# Patient Record
Sex: Male | Born: 1985 | Race: White | Hispanic: No | Marital: Single | Smoking: Current every day smoker
Health system: Southern US, Community
[De-identification: ages and names within clinical notes are randomized; demographics above are authoritative.]

## PROBLEM LIST (undated history)

## (undated) DIAGNOSIS — F32A Depression, unspecified: Secondary | ICD-10-CM

## (undated) DIAGNOSIS — Z9081 Acquired absence of spleen: Secondary | ICD-10-CM

## (undated) DIAGNOSIS — N201 Calculus of ureter: Secondary | ICD-10-CM

## (undated) DIAGNOSIS — K029 Dental caries, unspecified: Secondary | ICD-10-CM

## (undated) DIAGNOSIS — F419 Anxiety disorder, unspecified: Secondary | ICD-10-CM

## (undated) DIAGNOSIS — R358 Other polyuria: Secondary | ICD-10-CM

## (undated) DIAGNOSIS — E538 Deficiency of other specified B group vitamins: Secondary | ICD-10-CM

## (undated) DIAGNOSIS — F329 Major depressive disorder, single episode, unspecified: Secondary | ICD-10-CM

## (undated) DIAGNOSIS — E291 Testicular hypofunction: Secondary | ICD-10-CM

## (undated) DIAGNOSIS — Z23 Encounter for immunization: Secondary | ICD-10-CM

## (undated) DIAGNOSIS — D473 Essential (hemorrhagic) thrombocythemia: Secondary | ICD-10-CM

## (undated) DIAGNOSIS — J45909 Unspecified asthma, uncomplicated: Secondary | ICD-10-CM

## (undated) DIAGNOSIS — Z87442 Personal history of urinary calculi: Secondary | ICD-10-CM

## (undated) DIAGNOSIS — R12 Heartburn: Secondary | ICD-10-CM

## (undated) HISTORY — DX: Personal history of urinary calculi: Z87.442

## (undated) HISTORY — DX: Other polyuria: R35.8

## (undated) HISTORY — DX: Encounter for immunization: Z23

## (undated) HISTORY — DX: Depression, unspecified: F32.A

## (undated) HISTORY — DX: Deficiency of other specified B group vitamins: E53.8

## (undated) HISTORY — DX: Acquired absence of spleen: Z90.81

## (undated) HISTORY — DX: Testicular hypofunction: E29.1

## (undated) HISTORY — DX: Major depressive disorder, single episode, unspecified: F32.9

## (undated) HISTORY — DX: Unspecified asthma, uncomplicated: J45.909

## (undated) HISTORY — DX: Anxiety disorder, unspecified: F41.9

## (undated) HISTORY — PX: SPLENECTOMY, TOTAL: SHX788

## (undated) HISTORY — PX: CHOLECYSTECTOMY: SHX55

## (undated) HISTORY — DX: Dental caries, unspecified: K02.9

## (undated) HISTORY — DX: Essential (hemorrhagic) thrombocythemia: D47.3

## (undated) HISTORY — DX: Heartburn: R12

## (undated) HISTORY — DX: Calculus of ureter: N20.1

---

## 2006-07-24 ENCOUNTER — Emergency Department: Payer: Self-pay | Admitting: Emergency Medicine

## 2006-07-25 ENCOUNTER — Ambulatory Visit: Payer: Self-pay

## 2006-07-26 ENCOUNTER — Emergency Department: Payer: Self-pay | Admitting: Unknown Physician Specialty

## 2010-08-09 ENCOUNTER — Emergency Department: Payer: Self-pay | Admitting: Emergency Medicine

## 2015-05-26 ENCOUNTER — Encounter: Payer: Self-pay | Admitting: Family Medicine

## 2015-05-26 ENCOUNTER — Ambulatory Visit (INDEPENDENT_AMBULATORY_CARE_PROVIDER_SITE_OTHER): Payer: BLUE CROSS/BLUE SHIELD | Admitting: Family Medicine

## 2015-05-26 ENCOUNTER — Telehealth: Payer: Self-pay

## 2015-05-26 VITALS — BP 144/85 | HR 63 | Temp 98.2°F | Ht 70.0 in | Wt 190.0 lb

## 2015-05-26 DIAGNOSIS — Z9081 Acquired absence of spleen: Secondary | ICD-10-CM

## 2015-05-26 DIAGNOSIS — R358 Other polyuria: Secondary | ICD-10-CM | POA: Diagnosis not present

## 2015-05-26 DIAGNOSIS — R030 Elevated blood-pressure reading, without diagnosis of hypertension: Secondary | ICD-10-CM

## 2015-05-26 DIAGNOSIS — Z23 Encounter for immunization: Secondary | ICD-10-CM

## 2015-05-26 DIAGNOSIS — Z720 Tobacco use: Secondary | ICD-10-CM

## 2015-05-26 DIAGNOSIS — R5383 Other fatigue: Secondary | ICD-10-CM | POA: Diagnosis not present

## 2015-05-26 DIAGNOSIS — Z0000 Encounter for general adult medical examination without abnormal findings: Secondary | ICD-10-CM

## 2015-05-26 DIAGNOSIS — R3589 Other polyuria: Secondary | ICD-10-CM

## 2015-05-26 DIAGNOSIS — F411 Generalized anxiety disorder: Secondary | ICD-10-CM

## 2015-05-26 DIAGNOSIS — F341 Dysthymic disorder: Secondary | ICD-10-CM | POA: Diagnosis not present

## 2015-05-26 HISTORY — DX: Acquired absence of spleen: Z90.81

## 2015-05-26 HISTORY — DX: Other polyuria: R35.89

## 2015-05-26 HISTORY — DX: Encounter for immunization: Z23

## 2015-05-26 MED ORDER — TETANUS-DIPHTH-ACELL PERTUSSIS 5-2.5-18.5 LF-MCG/0.5 IM SUSP
0.5000 mL | Freq: Once | INTRAMUSCULAR | Status: AC
  Administered 2015-05-26: 0.5 mL via INTRAMUSCULAR

## 2015-05-26 NOTE — Patient Instructions (Addendum)
We'll let you know about your lab results Please go from here to [facility], at [place], [place] Their number is [phone] if you need directions Return to see me in 2 weeks You received the vaccine to protect against tetanus and diphtheria and pertussis today; the tetanus and diphtheria portions will provide protection up to ten years, and the pertussis component will give you protection against whooping cough for life

## 2015-05-26 NOTE — Telephone Encounter (Signed)
I'm sorry to hear that; please give him information to [facility]; we can do further referrals if needed

## 2015-05-26 NOTE — Progress Notes (Signed)
BP 144/85 mmHg  Pulse 63  Temp(Src) 98.2 F (36.8 C)  Ht 5\' 10"  (1.778 m)  Wt 190 lb (86.183 kg)  BMI 27.26 kg/m2  SpO2 99%   Subjective:    Patient ID: Riley Obrien, male    DOB: 08-Apr-1986, 29 y.o.   MRN: [id number]  HPI: Riley Obrien is a 29 y.o. male  Chief Complaint  Patient presents with  . Depression  . Anxiety   He has a program at work where he gets free visits; he tried to hyponotize him and he checked out after that I asked how long he has had issues with anxiety and depression and he said "29 years" He works at [employer], [employer] and makes Garment/textile [employer] They cover counselors, but not doctors' visits he says Most people have told him over the years that it's just his personality He found out that his grandmother was on valium for 25 years; he just found this out recently; he doesn't know what the deal is; he did not specifically ask for valium He has lost 7-8 pounds this week; he did not eat for 4 days; he isn't sure if he was just worrying about this or something else His mind does not stop, "not ever;" he ruminates on things; he is not going to jeopardize his job for relief I asked if anyone has considered ADHD or other diagnoses, and he said no, he's never been evaluated His appetite is affected by his anxiety in general; once, he went from 160 pounds to 240 pounds over a six month period during an unhealthy relationship; he is slimming back down; he used food as a coping strategy he realized He has never tried any medicine; just tried to deal with it; he has been encouraged to seek help He says the last question is a 3 because he feels like people would be better off without him; he is not suicidal; never tried to commit suicide, does not believe in it  Depression screen [facility] 2/9 05/26/2015  Decreased Interest 3  Down, Depressed, Hopeless 3  PHQ - 2 Score 6  Altered sleeping 1  Tired, decreased energy 2  Change in appetite 3  Feeling bad or  failure about yourself  3  Trouble concentrating 3  Moving slowly or fidgety/restless 3  Suicidal thoughts 3  PHQ-9 Score 24  again, he states he is not actively suicidal; just answered that because he thinks it wouldn't matter if he wasn't hurt but he has no intention of harming himself and never has  Relevant past medical, surgical, family and social history reviewed and updated as indicated. Interim medical history since our last visit reviewed. Allergies and medications reviewed and updated.  Review of Systems  Constitutional: Negative for unexpected weight change (fluctuated over last year, but not extreme and stable over the last few weeks).  Gastrointestinal:       Maybe indigestion or stress-related; takes stuff and it goes away  Endocrine: Positive for heat intolerance, polydipsia, polyphagia and polyuria. Negative for cold intolerance.  Hematological: Does not bruise/bleed easily.  Psychiatric/Behavioral: Positive for dysphoric mood, decreased concentration and agitation. Negative for suicidal ideas, sleep disturbance and self-injury. The patient is nervous/anxious and is hyperactive.   Per HPI unless specifically indicated above     Objective:    BP 144/85 mmHg  Pulse 63  Temp(Src) 98.2 F (36.8 C)  Ht 5\' 10"  (1.778 m)  Wt 190 lb (86.183 kg)  BMI 27.26 kg/m2  SpO2  99%  Wt Readings from Last 3 Encounters:  05/26/15 190 lb (86.183 kg)    Physical Exam  Eyes: No scleral icterus.  Neck: No thyromegaly present.  Cardiovascular: Normal rate, regular rhythm and normal heart sounds.   Pulmonary/Chest: Effort normal and breath sounds normal.  Abdominal: He exhibits no distension.  Musculoskeletal: He exhibits no edema.  Neurological: He has normal reflexes.  Skin: Skin is warm and dry.  Psychiatric: His mood appears anxious (perhaps a little anxious). His affect is not angry, not blunt, not labile and not inappropriate. His speech is not rapid and/or pressured and not  tangential. He is not agitated, not aggressive, not hyperactive, not withdrawn, not actively hallucinating and not combative. Thought content is not paranoid and not delusional. Cognition and memory are not impaired. He does not express impulsivity or inappropriate judgment. He exhibits a depressed mood (mildly depressed, affect a little flattened, but good eye contact with examiner). He expresses no homicidal and no suicidal ideation. He expresses no suicidal plans and no homicidal plans. He is attentive.   No results found for this or any previous visit.    Assessment & Plan:   Problem List Items Addressed This Visit      Genitourinary   Polyuria    Check glucose today with strong family hx of diabetes        Other   GAD (generalized anxiety disorder) - Primary    Refer to psychiatrist; sounds to have inherited component; he did not strike me at all today as someone seeking benzos and is open to working with counselor and psychiatrist; close f/u      Dysthymia    Refer to psych; patient does not sound to have episodes of recurrent major depressive disorder; this sounds more like a chronic dysthymia; I did not start medicine today as he is going to go straight from here to [place], a behavioral medicine center and meet with a counselor and then get established with a psychiatrist; patient agrees that if he ever does become depressed or upset to the point of considering suicidal that he will call 911 or seek immediate appropriate help      Relevant Orders   Vitamin B12   Vit D  25 hydroxy (rtn osteoporosis monitoring)   TSH   History of splenectomy    He isn't sure about vaccine; request record from pediatrician; will want to make sure about pneumonia and meningitis vaccine status      Relevant Orders   CBC with Differential/Platelet   Tobacco abuse    We will address this later; pt not ready to quit      Need for vaccination with combined diphtheria-tetanus-pertussis (DTaP)    Relevant Medications   Tdap (BOOSTRIX) injection 0.5 mL (Completed)    Other Visit Diagnoses    Other fatigue        Relevant Orders    Vitamin B12    Vit D  25 hydroxy (rtn osteoporosis monitoring)    TSH    Elevated blood pressure (not hypertension)        first visit to a doctor in a long time; patient anxious; will recheck at f/u but no meds at this time    Preventative health care        Relevant Orders    Lipid Panel w/o Chol/HDL Ratio    Comprehensive metabolic panel        Follow up plan: Return in about 2 weeks (around 06/09/2015) for follow-up.

## 2015-05-26 NOTE — Assessment & Plan Note (Signed)
Check glucose today with strong family hx of diabetes

## 2015-05-26 NOTE — Telephone Encounter (Signed)
Patient called and stated he spent 2 hours filling out paperwork at [facility] and then they were rude and short and then only spent 10 mins with him and told him to come back tomorrow doing the walk in hours. He was very upset with the way he was treated and wants to go somewhere else. [phone]

## 2015-05-26 NOTE — Telephone Encounter (Signed)
Left detailed message for patient to contact [facility] in the morning. Asked that if he had any issues with getting in with them to call me and let me know.

## 2015-05-26 NOTE — Assessment & Plan Note (Signed)
We will address this later; pt not ready to quit

## 2015-05-26 NOTE — Assessment & Plan Note (Addendum)
Refer to psych; patient does not sound to have episodes of recurrent major depressive disorder; this sounds more like a chronic dysthymia; I did not start medicine today as he is going to go straight from here to [place], a behavioral medicine center and meet with a counselor and then get established with a psychiatrist; patient agrees that if he ever does become depressed or upset to the point of considering suicidal that he will call 911 or seek immediate appropriate help

## 2015-05-26 NOTE — Assessment & Plan Note (Addendum)
Refer to psychiatrist; sounds to have inherited component; he did not strike me at all today as someone seeking benzos and is open to working with counselor and psychiatrist; close f/u

## 2015-05-26 NOTE — Assessment & Plan Note (Signed)
He isn't sure about vaccine; request record from pediatrician; will want to make sure about pneumonia and meningitis vaccine status

## 2015-05-27 LAB — LIPID PANEL W/O CHOL/HDL RATIO
CHOLESTEROL TOTAL: 180 mg/dL (ref 100–199)
HDL: 31 mg/dL — AB (ref >39)
LDL CALC: 126 mg/dL — AB (ref 0–99)
Triglycerides: 114 mg/dL (ref 0–149)
VLDL Cholesterol Cal: 23 mg/dL (ref 5–40)

## 2015-05-27 LAB — COMPREHENSIVE METABOLIC PANEL
ALBUMIN: 4.4 g/dL (ref 3.5–5.5)
ALT: 23 IU/L (ref 0–44)
AST: 21 IU/L (ref 0–40)
Albumin/Globulin Ratio: 1.4 (ref 1.1–2.5)
Alkaline Phosphatase: 79 IU/L (ref 39–117)
BUN / CREAT RATIO: 8 (ref 8–19)
BUN: 7 mg/dL (ref 6–20)
Bilirubin Total: 0.5 mg/dL (ref 0.0–1.2)
CHLORIDE: 99 mmol/L (ref 97–108)
CO2: 22 mmol/L (ref 18–29)
CREATININE: 0.85 mg/dL (ref 0.76–1.27)
Calcium: 9.5 mg/dL (ref 8.7–10.2)
GFR calc non Af Amer: 118 mL/min/{1.73_m2} (ref >59)
GFR, EST AFRICAN AMERICAN: 136 mL/min/{1.73_m2} (ref >59)
Globulin, Total: 3.1 g/dL (ref 1.5–4.5)
Glucose: 87 mg/dL (ref 65–99)
Potassium: 4.5 mmol/L (ref 3.5–5.2)
SODIUM: 137 mmol/L (ref 134–144)
Total Protein: 7.5 g/dL (ref 6.0–8.5)

## 2015-05-27 LAB — CBC WITH DIFFERENTIAL/PLATELET
Basophils Absolute: 0.1 10*3/uL (ref 0.0–0.2)
Basos: 1 %
EOS (ABSOLUTE): 0.6 10*3/uL — ABNORMAL HIGH (ref 0.0–0.4)
Eos: 4 %
HEMATOCRIT: 44.2 % (ref 37.5–51.0)
Hemoglobin: 15.6 g/dL (ref 12.6–17.7)
Immature Grans (Abs): 0 10*3/uL (ref 0.0–0.1)
Immature Granulocytes: 0 %
LYMPHS ABS: 4.2 10*3/uL — AB (ref 0.7–3.1)
Lymphs: 30 %
MCH: 29.9 pg (ref 26.6–33.0)
MCHC: 35.3 g/dL (ref 31.5–35.7)
MCV: 85 fL (ref 79–97)
MONOS ABS: 1.9 10*3/uL — AB (ref 0.1–0.9)
Monocytes: 13 %
NEUTROS PCT: 52 %
Neutrophils Absolute: 7.2 10*3/uL — ABNORMAL HIGH (ref 1.4–7.0)
Platelets: 537 10*3/uL — ABNORMAL HIGH (ref 150–379)
RBC: 5.21 x10E6/uL (ref 4.14–5.80)
RDW: 14.2 % (ref 12.3–15.4)
WBC: 14 10*3/uL — AB (ref 3.4–10.8)

## 2015-05-27 LAB — VITAMIN B12: VITAMIN B 12: 290 pg/mL (ref 211–946)

## 2015-05-27 LAB — VITAMIN D 25 HYDROXY (VIT D DEFICIENCY, FRACTURES): Vit D, 25-Hydroxy: 15.5 ng/mL — ABNORMAL LOW (ref 30.0–100.0)

## 2015-05-27 LAB — TSH: TSH: 0.861 u[IU]/mL (ref 0.450–4.500)

## 2015-05-30 ENCOUNTER — Telehealth: Payer: Self-pay | Admitting: Family Medicine

## 2015-05-30 DIAGNOSIS — E538 Deficiency of other specified B group vitamins: Secondary | ICD-10-CM

## 2015-05-30 DIAGNOSIS — D72820 Lymphocytosis (symptomatic): Secondary | ICD-10-CM | POA: Insufficient documentation

## 2015-05-30 DIAGNOSIS — D473 Essential (hemorrhagic) thrombocythemia: Secondary | ICD-10-CM | POA: Insufficient documentation

## 2015-05-30 DIAGNOSIS — D75839 Thrombocytosis, unspecified: Secondary | ICD-10-CM

## 2015-05-30 DIAGNOSIS — E559 Vitamin D deficiency, unspecified: Secondary | ICD-10-CM

## 2015-05-30 HISTORY — DX: Thrombocytosis, unspecified: D75.839

## 2015-05-30 HISTORY — DX: Deficiency of other specified B group vitamins: E53.8

## 2015-05-30 NOTE — Telephone Encounter (Signed)
Please let patient know that his B12 is low, so I'll recommend he get B12 1000 mcg sublingual and take one a day (dissolve under the tongue for better absorption); low B12 can cause poor energy His vitamin D is quite low; start Rx vitamin D 5,000 iu once a week for 2 months, then 1000 iu daily; low vitamin D can also cause fatigue Please add both vitamins to med list He is making too many white blood cells, lots of different types, but I don't know why; I'd like him to come in Tuesday or Wednesday to have that repeated (CBC, peripheral smear pathology review) Keep next appt with me as already scheduled July 14th

## 2015-05-31 NOTE — Telephone Encounter (Signed)
Left message to call.

## 2015-06-02 NOTE — Telephone Encounter (Signed)
Patient notified. He is on vacation and can't come in this week for labs. He will come in on Monday for labs.

## 2015-06-06 ENCOUNTER — Other Ambulatory Visit: Payer: Self-pay | Admitting: Family Medicine

## 2015-06-06 ENCOUNTER — Telehealth: Payer: Self-pay | Admitting: Family Medicine

## 2015-06-06 ENCOUNTER — Other Ambulatory Visit: Payer: BLUE CROSS/BLUE SHIELD

## 2015-06-06 DIAGNOSIS — R5383 Other fatigue: Secondary | ICD-10-CM

## 2015-06-06 DIAGNOSIS — D75839 Thrombocytosis, unspecified: Secondary | ICD-10-CM

## 2015-06-06 DIAGNOSIS — D72820 Lymphocytosis (symptomatic): Secondary | ICD-10-CM

## 2015-06-06 DIAGNOSIS — D473 Essential (hemorrhagic) thrombocythemia: Secondary | ICD-10-CM

## 2015-06-06 NOTE — Telephone Encounter (Signed)
Please call pt ASAP regarding labs. Thanks.

## 2015-06-06 NOTE — Telephone Encounter (Signed)
Dr. Sanda Klein, he wants to know if he could have a testosterone added on to the labs he had done this am? Lattie Haw drew the tube if it is ok.

## 2015-06-07 ENCOUNTER — Telehealth: Payer: Self-pay | Admitting: Family Medicine

## 2015-06-07 DIAGNOSIS — D75839 Thrombocytosis, unspecified: Secondary | ICD-10-CM

## 2015-06-07 DIAGNOSIS — D473 Essential (hemorrhagic) thrombocythemia: Secondary | ICD-10-CM

## 2015-06-07 DIAGNOSIS — D72820 Lymphocytosis (symptomatic): Secondary | ICD-10-CM

## 2015-06-07 LAB — CBC WITH DIFFERENTIAL/PLATELET
BASOS: 1 %
Basophils Absolute: 0.1 10*3/uL (ref 0.0–0.2)
EOS (ABSOLUTE): 0.6 10*3/uL — ABNORMAL HIGH (ref 0.0–0.4)
EOS: 4 %
Hematocrit: 45.8 % (ref 37.5–51.0)
Hemoglobin: 16.2 g/dL (ref 12.6–17.7)
Immature Grans (Abs): 0 10*3/uL (ref 0.0–0.1)
Immature Granulocytes: 0 %
Lymphocytes Absolute: 3.9 10*3/uL — ABNORMAL HIGH (ref 0.7–3.1)
Lymphs: 26 %
MCH: 29.8 pg (ref 26.6–33.0)
MCHC: 35.4 g/dL (ref 31.5–35.7)
MCV: 84 fL (ref 79–97)
MONOS ABS: 1.5 10*3/uL — AB (ref 0.1–0.9)
Monocytes: 10 %
NEUTROS ABS: 8.5 10*3/uL — AB (ref 1.4–7.0)
NRBC: 0 % (ref 0–0)
Neutrophils: 59 %
PLATELETS: 538 10*3/uL — AB (ref 150–379)
RBC: 5.44 x10E6/uL (ref 4.14–5.80)
RDW: 14.5 % (ref 12.3–15.4)
WBC: 14.7 10*3/uL — AB (ref 3.4–10.8)

## 2015-06-07 LAB — TESTOSTERONE, FREE, TOTAL, SHBG: Testosterone, Free: 8.1 pg/mL — ABNORMAL LOW (ref 9.3–26.5)

## 2015-06-07 NOTE — Telephone Encounter (Signed)
I called home number to discuss labs Left message, will try again later

## 2015-06-09 ENCOUNTER — Ambulatory Visit (INDEPENDENT_AMBULATORY_CARE_PROVIDER_SITE_OTHER): Payer: BLUE CROSS/BLUE SHIELD | Admitting: Family Medicine

## 2015-06-09 ENCOUNTER — Encounter: Payer: Self-pay | Admitting: Family Medicine

## 2015-06-09 VITALS — BP 118/77 | HR 88 | Temp 98.0°F | Wt 187.0 lb

## 2015-06-09 DIAGNOSIS — D473 Essential (hemorrhagic) thrombocythemia: Secondary | ICD-10-CM | POA: Diagnosis not present

## 2015-06-09 DIAGNOSIS — D75839 Thrombocytosis, unspecified: Secondary | ICD-10-CM

## 2015-06-09 DIAGNOSIS — D72820 Lymphocytosis (symptomatic): Secondary | ICD-10-CM

## 2015-06-09 DIAGNOSIS — E291 Testicular hypofunction: Secondary | ICD-10-CM | POA: Diagnosis not present

## 2015-06-09 DIAGNOSIS — K029 Dental caries, unspecified: Secondary | ICD-10-CM | POA: Diagnosis not present

## 2015-06-09 DIAGNOSIS — K069 Disorder of gingiva and edentulous alveolar ridge, unspecified: Secondary | ICD-10-CM | POA: Diagnosis not present

## 2015-06-09 DIAGNOSIS — E538 Deficiency of other specified B group vitamins: Secondary | ICD-10-CM | POA: Diagnosis not present

## 2015-06-09 DIAGNOSIS — E559 Vitamin D deficiency, unspecified: Secondary | ICD-10-CM

## 2015-06-09 DIAGNOSIS — Z9081 Acquired absence of spleen: Secondary | ICD-10-CM | POA: Diagnosis not present

## 2015-06-09 DIAGNOSIS — F909 Attention-deficit hyperactivity disorder, unspecified type: Secondary | ICD-10-CM

## 2015-06-09 DIAGNOSIS — F341 Dysthymic disorder: Secondary | ICD-10-CM

## 2015-06-09 DIAGNOSIS — F411 Generalized anxiety disorder: Secondary | ICD-10-CM

## 2015-06-09 DIAGNOSIS — Z720 Tobacco use: Secondary | ICD-10-CM

## 2015-06-09 DIAGNOSIS — R7989 Other specified abnormal findings of blood chemistry: Secondary | ICD-10-CM

## 2015-06-09 DIAGNOSIS — F900 Attention-deficit hyperactivity disorder, predominantly inattentive type: Secondary | ICD-10-CM | POA: Diagnosis not present

## 2015-06-09 HISTORY — DX: Dental caries, unspecified: K02.9

## 2015-06-09 NOTE — Assessment & Plan Note (Signed)
Refer to hematologist °

## 2015-06-09 NOTE — Assessment & Plan Note (Signed)
Refer to oral surgeon 

## 2015-06-09 NOTE — Assessment & Plan Note (Addendum)
Peripheral smear pending; he actually has elevation of four of the five populations of white blood cells; refer to hematologist

## 2015-06-09 NOTE — Assessment & Plan Note (Signed)
Taking replacement, SL; recheck in 3 months

## 2015-06-09 NOTE — Assessment & Plan Note (Signed)
Seeing [employer] and psychiatrist

## 2015-06-09 NOTE — Assessment & Plan Note (Signed)
Refer to hematologist; peripheral smear pending

## 2015-06-09 NOTE — Assessment & Plan Note (Addendum)
Working with [employer] and psychiatrist

## 2015-06-09 NOTE — Assessment & Plan Note (Signed)
He is not ready to quit yet 

## 2015-06-09 NOTE — Assessment & Plan Note (Signed)
Elevated/abnormal WBC population; refer to hematologist

## 2015-06-09 NOTE — Assessment & Plan Note (Signed)
Awaiting outside records to see if he is UTD on meningitis and pneumococcal vaccines

## 2015-06-09 NOTE — Assessment & Plan Note (Signed)
Patient requests evaluation by urologist to see if testosterone therapy might be beneficial; with his mood and fatigue, therapy may be beneficial

## 2015-06-09 NOTE — Assessment & Plan Note (Deleted)
Significant score suggestive of adult ADHD; patient is aware of risks of stimulant medication including heart attack; controlled substance speech given

## 2015-06-09 NOTE — Telephone Encounter (Signed)
Patient coming in today, will address labs then I will go ahead and put in the referral to hematologist to avoid anything getting dropped in case he no shows

## 2015-06-09 NOTE — Progress Notes (Signed)
BP 118/77 mmHg  Pulse 88  Temp(Src) 98 F (36.7 C)  Wt 187 lb (84.823 kg)  SpO2 98%   Subjective:    Patient ID: Riley Obrien, male    DOB: 06-26-1986, 29 y.o.   MRN: [id number]  HPI: Riley Obrien is a 29 y.o. male  Chief Complaint  Patient presents with  . Anxiety    follow up, going to [place]  . OTHER    discuss lab results  Patient established care with me just a few weeks ago; he had not seen a doctor for many years and has several things to go over; he had labs done and we reviewed those as well today He is going to [facility] now for his anxiety and mood; he goes back tomorrow; they just adjusted his medicine yesterday Cannot tell a difference yet, but has only been on the medicine for a short time; they told him one of the medicines might help him not want to smoke as much, but he can't tell a difference in that either; he is not ready to quit; he has a Social worker and a psychiatrist  He had an abnormal CBC and we reviewed that; peripheral smear pending, but the CBC and repeat are back; he had elevated WBC and platelets; he had a splenectomy but doesn't recall specifically if any comments were made years ago about either of those tests being abnormal; I asked about infection; he says he has terrible teeth and needs to have them all pulled, but would not let the examiner see them; he denies GI / parasitic symptoms but does have stomach upset from time to time, nothing new  He has symptoms suggestive of low testosterone; he says he took a quiz and scored positive for most of the questions; low libido, fatigue  He is taking vitamin D and vitamin B12 supplementation now, as both of those were found to be low with his recent labs  Relevant past medical, surgical, family and social history reviewed and updated as indicated. Interim medical history since our last visit reviewed. Allergies and medications reviewed and updated.  Review of Systems  Constitutional:  Negative for fever.  HENT: Negative for ear pain and nosebleeds.        My teeth are pretty messed up; he declined me looking at them  Eyes: Negative for pain and itching.  Gastrointestinal: Negative for diarrhea and blood in stool.       My stomach is always messed up; regular daily BMs, no diarrhea  Genitourinary: Negative for hematuria.  Musculoskeletal: Negative for joint swelling.  Skin: Negative for wound.  Neurological: Negative for tremors.  Hematological: Bruises/bleeds easily (bleeds for a while when he gets cut).   Per HPI unless specifically indicated above     Objective:    BP 118/77 mmHg  Pulse 88  Temp(Src) 98 F (36.7 C)  Wt 187 lb (84.823 kg)  SpO2 98%  Wt Readings from Last 3 Encounters:  06/09/15 187 lb (84.823 kg)  05/26/15 190 lb (86.183 kg)    Physical Exam  Constitutional: He appears well-developed and well-nourished. No distress.  HENT:  Head: Normocephalic and atraumatic.  Patient refused exam  Eyes: EOM are normal. No scleral icterus.  Cardiovascular: Normal rate.   Pulmonary/Chest: Effort normal and breath sounds normal.  Abdominal: He exhibits no distension.  Neurological: He is alert. He displays no atrophy and no tremor.  Skin: No bruising, no ecchymosis and no petechiae noted. No cyanosis.  Numerous tattoos on the arms  Psychiatric: His speech is normal and behavior is normal. Judgment and thought content normal. His mood appears not anxious. His affect is blunt (affect somewhat flattened). His affect is not angry, not labile and not inappropriate. Cognition and memory are normal. He does not exhibit a depressed mood.  Fair eye contact with examiner    Results for orders placed or performed in visit on 06/06/15  CBC with Differential/Platelet  Result Value Ref Range   WBC 14.7 (H) 3.4 - 10.8 x10E3/uL   RBC 5.44 4.14 - 5.80 x10E6/uL   Hemoglobin 16.2 12.6 - 17.7 g/dL   Hematocrit 45.8 37.5 - 51.0 %   MCV 84 79 - 97 fL   MCH 29.8 26.6 -  33.0 pg   MCHC 35.4 31.5 - 35.7 g/dL   RDW 14.5 12.3 - 15.4 %   Platelets 538 (H) 150 - 379 x10E3/uL   Neutrophils 59 %   Lymphs 26 %   Monocytes 10 %   Eos 4 %   Basos 1 %   Neutrophils Absolute 8.5 (H) 1.4 - 7.0 x10E3/uL   Lymphocytes Absolute 3.9 (H) 0.7 - 3.1 x10E3/uL   Monocytes Absolute 1.5 (H) 0.1 - 0.9 x10E3/uL   EOS (ABSOLUTE) 0.6 (H) 0.0 - 0.4 x10E3/uL   Basophils Absolute 0.1 0.0 - 0.2 x10E3/uL   Immature Granulocytes 0 %   Immature Grans (Abs) 0.0 0.0 - 0.1 x10E3/uL   NRBC 0 0-0 %   Hematology Comments: Note:       Assessment & Plan:   Problem List Items Addressed This Visit      Digestive   B12 deficiency    Taking replacement, SL; recheck in 3 months      Dental caries    Refer to oral surgeon      Relevant Orders   Ambulatory referral to Oral Maxillofacial Surgery   Gum disease    Refer to oral surgeon      Relevant Orders   Ambulatory referral to Oral Maxillofacial Surgery     Hematopoietic and Hemostatic   Thrombocytosis - Primary    Refer to hematologist; peripheral smear pending        Other   GAD (generalized anxiety disorder)    Working with [facility] and psychiatrist      Dysthymia    Seeing Writer and psychiatrist      History of splenectomy    Awaiting outside records to see if he is UTD on meningitis and pneumococcal vaccines      Tobacco abuse    He is not ready to quit yet      Lymphocytosis    Peripheral smear pending; he actually has elevation of four of the five populations of white blood cells; refer to hematologist      Vitamin D deficiency    Taking replacement; recheck that in 3 months      Low testosterone    Patient requests evaluation by urologist to see if testosterone therapy might be beneficial; with his mood and fatigue, therapy may be beneficial      Relevant Orders   Ambulatory referral to Urology   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

## 2015-06-09 NOTE — Assessment & Plan Note (Signed)
Taking replacement; recheck that in 3 months

## 2015-06-09 NOTE — Patient Instructions (Signed)
Recheck labs in 3 months Continue the vitamin D and vitamin B12 I've put in referrals to the urologist and hematologist and oral surgeon If you have not heard anything from my staff in a week about any orders/referrals/studies from today, please contact us here to follow-up [phone]

## 2015-06-14 ENCOUNTER — Encounter: Payer: Self-pay | Admitting: Family Medicine

## 2015-06-14 LAB — PATHOLOGIST SMEAR REVIEW: Path Rev RBC: NORMAL

## 2015-06-15 ENCOUNTER — Emergency Department: Payer: BLUE CROSS/BLUE SHIELD

## 2015-06-15 ENCOUNTER — Ambulatory Visit: Payer: Self-pay | Admitting: Internal Medicine

## 2015-06-15 ENCOUNTER — Emergency Department
Admission: EM | Admit: 2015-06-15 | Discharge: 2015-06-15 | Disposition: A | Discharge status: Home / Self Care | Payer: BLUE CROSS/BLUE SHIELD | Attending: Student | Admitting: Student

## 2015-06-15 DIAGNOSIS — R109 Unspecified abdominal pain: Secondary | ICD-10-CM

## 2015-06-15 DIAGNOSIS — Z79899 Other long term (current) drug therapy: Secondary | ICD-10-CM | POA: Insufficient documentation

## 2015-06-15 DIAGNOSIS — N23 Unspecified renal colic: Secondary | ICD-10-CM

## 2015-06-15 DIAGNOSIS — Z720 Tobacco use: Secondary | ICD-10-CM | POA: Diagnosis not present

## 2015-06-15 LAB — BASIC METABOLIC PANEL
Anion gap: 11 (ref 5–15)
BUN: 8 mg/dL (ref 6–20)
CHLORIDE: 102 mmol/L (ref 101–111)
CO2: 22 mmol/L (ref 22–32)
CREATININE: 0.9 mg/dL (ref 0.61–1.24)
Calcium: 9.2 mg/dL (ref 8.9–10.3)
GFR calc Af Amer: >60 mL/min (ref >60)
Glucose, Bld: 111 mg/dL — ABNORMAL HIGH (ref 65–99)
Potassium: 3.6 mmol/L (ref 3.5–5.1)
Sodium: 135 mmol/L (ref 135–145)

## 2015-06-15 LAB — URINALYSIS COMPLETE WITH MICROSCOPIC (ARMC ONLY)
BACTERIA UA: NONE SEEN
Bilirubin Urine: NEGATIVE
Glucose, UA: NEGATIVE mg/dL
Hgb urine dipstick: NEGATIVE
Ketones, ur: NEGATIVE mg/dL
Leukocytes, UA: NEGATIVE
Nitrite: NEGATIVE
PH: 6 (ref 5.0–8.0)
Protein, ur: NEGATIVE mg/dL
RBC / HPF: NONE SEEN RBC/hpf (ref 0–5)
Specific Gravity, Urine: 1.015 (ref 1.005–1.030)
Squamous Epithelial / LPF: NONE SEEN

## 2015-06-15 LAB — HEPATIC FUNCTION PANEL
ALT: 30 U/L (ref 17–63)
AST: 27 U/L (ref 15–41)
Albumin: 4.5 g/dL (ref 3.5–5.0)
Alkaline Phosphatase: 78 U/L (ref 38–126)
BILIRUBIN DIRECT: 0.1 mg/dL (ref 0.1–0.5)
BILIRUBIN TOTAL: 0.7 mg/dL (ref 0.3–1.2)
Indirect Bilirubin: 0.6 mg/dL (ref 0.3–0.9)
TOTAL PROTEIN: 8.3 g/dL — AB (ref 6.5–8.1)

## 2015-06-15 LAB — CBC
HEMATOCRIT: 43 % (ref 40.0–52.0)
Hemoglobin: 15.8 g/dL (ref 13.0–18.0)
MCH: 30.1 pg (ref 26.0–34.0)
MCHC: 36.7 g/dL — ABNORMAL HIGH (ref 32.0–36.0)
MCV: 81.8 fL (ref 80.0–100.0)
Platelets: 497 10*3/uL — ABNORMAL HIGH (ref 150–440)
RBC: 5.25 MIL/uL (ref 4.40–5.90)
RDW: 13.6 % (ref 11.5–14.5)
WBC: 21 10*3/uL — AB (ref 3.8–10.6)

## 2015-06-15 MED ORDER — OXYCODONE HCL 5 MG PO TABS: 5.0000 mg | ORAL_TABLET | Freq: Four times a day (QID) | ORAL | Status: DC | PRN

## 2015-06-15 MED ORDER — TAMSULOSIN HCL 0.4 MG PO CAPS: 0.4000 mg | ORAL_CAPSULE | Freq: Every day | ORAL | Status: DC

## 2015-06-15 MED ORDER — IOHEXOL 300 MG/ML  SOLN
100.0000 mL | Freq: Once | INTRAMUSCULAR | Status: AC | PRN
  Administered 2015-06-15: 100 mL via INTRAVENOUS

## 2015-06-15 MED ORDER — KETOROLAC TROMETHAMINE 60 MG/2ML IM SOLN
60.0000 mg | Freq: Once | INTRAMUSCULAR | Status: AC
  Administered 2015-06-15: 60 mg via INTRAMUSCULAR
  Filled 2015-06-15: qty 2

## 2015-06-15 MED ORDER — ONDANSETRON 4 MG PO TBDP: 4.0000 mg | ORAL_TABLET | Freq: Three times a day (TID) | ORAL | Status: DC | PRN

## 2015-06-15 MED ORDER — SODIUM CHLORIDE 0.9 % IV BOLUS (SEPSIS)
500.0000 mL | Freq: Once | INTRAVENOUS | Status: AC
  Administered 2015-06-15: 500 mL via INTRAVENOUS

## 2015-06-15 MED ORDER — IOHEXOL 240 MG/ML SOLN
25.0000 mL | Freq: Once | INTRAMUSCULAR | Status: AC | PRN
  Administered 2015-06-15: 25 mL via ORAL

## 2015-06-15 NOTE — ED Notes (Signed)
Call to CT scan, spoke with [facility]. Let him know pt has finished contrast.

## 2015-06-15 NOTE — ED Notes (Signed)
Awaiting fluids to finish infusing before pt discharged home.

## 2015-06-15 NOTE — ED Notes (Addendum)
Pt states that he works third shift, was up all night working and suddenly developed right sided flank pain "feels like a balloon blowing up in my back". Pt has to keep pressure on back and stand to alleviate pain. Color WNL, ambulatory. Pt denies urinary sx.

## 2015-06-15 NOTE — ED Provider Notes (Signed)
[facility] Emergency Department Provider Note  ____________________________________________  Time seen: Approximately 7:52 AM  I have reviewed the triage vital signs and the nursing notes.   HISTORY  Chief Complaint Flank Pain    HPI TALHA ISER is a 29 y.o. male history of anxiety, asthma, history of total splenectomy and cholecystectomy at the age of 6 though he can't remember why, lymphocytosis currently being evaluated by oncology for elevation of multiple cell lines who presents with sudden onset right colicky/cramping waxing and waning flank pain, constant since onset. He reports movement sometimes makes it worse. He says it feels "like somebody is blowing up a balloon in my kidney". He denies any urinary complaints. No nausea, vomiting, diarrhea, fevers, chills.Currently his pain is severe. No history of IV drug use, no fevers, no bowel or bladder incontinence, no personal history/diagnosis of malignancy.   Past Medical History  Diagnosis Date  . Asthma   . Anxiety     Patient Active Problem List   Diagnosis Date Noted  . Dental caries 06/09/2015  . Gum disease 06/09/2015  . Low testosterone 06/09/2015  . Lymphocytosis 05/30/2015  . Thrombocytosis 05/30/2015  . B12 deficiency 05/30/2015  . Vitamin D deficiency 05/30/2015  . GAD (generalized anxiety disorder) 05/26/2015  . Dysthymia 05/26/2015  . 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 Tobacco abuse 05/26/2015  . Polyuria 05/26/2015  . Need for vaccination with combined diphtheria-tetanus-pertussis (DTaP) 05/26/2015    Past Surgical History  Procedure Laterality Date  . Splenectomy, total    . Cholecystectomy      Current Outpatient Rx  Name  Route  Sig  Dispense  Refill  . buPROPion (WELLBUTRIN) 75 MG tablet   Oral   Take 75 mg by mouth 2 (two) times daily.          . busPIRone (BUSPAR) 10 MG tablet   Oral   Take 10 mg by mouth 3 (three) times daily.          .  Cholecalciferol (VITAMIN D3) 5000 UNITS TABS   Oral   Take 5,000 Units by mouth once a week.         . vitamin B-12 (CYANOCOBALAMIN) 1000 MCG tablet   Oral   Take 1,000 mcg by mouth daily.           Allergies Review of patient's allergies indicates no known allergies.  Family History  Problem Relation Age of Onset  . Diabetes Maternal Grandmother   . Alzheimer's disease Paternal Grandmother     Social History History  Substance Use Topics  . Smoking status: Current Every Day Smoker -- 1.00 packs/day for 14 years    Types: Cigarettes  . Smokeless tobacco: Never Used  . Alcohol Use: No    Review of Systems Constitutional: No fever/chills Eyes: No visual changes. ENT: No sore throat. Cardiovascular: Denies chest pain. Respiratory: Denies shortness of breath. Gastrointestinal: No abdominal pain.  No nausea, no vomiting.  No diarrhea.  No constipation. Genitourinary: Negative for dysuria. Musculoskeletal: Positive for right  back pain. Skin: Negative for rash. Neurological: Negative for headaches, focal weakness or numbness.  10-point ROS otherwise negative.  ____________________________________________   PHYSICAL EXAM:  VITAL SIGNS: ED Triage Vitals  Enc Vitals Group     BP 06/15/15 0729 171/90 mmHg     Pulse Rate 06/15/15 0729 109     Resp 06/15/15 0729 22     Temp 06/15/15 0729 97.9 F (36.6 C)     Temp Source 06/15/15  7948 Oral     SpO2 06/15/15 0729 96 %     Weight 06/15/15 0729 195 lb (88.451 kg)     Height 06/15/15 0729 6' (1.829 m)     Head Cir --      Peak Flow --      Pain Score 06/15/15 0729 10     Pain Loc --      Pain Edu? --      Excl. in [place]? --     Constitutional: Alert and oriented. Pacing in the room, unable to find a comfortable position. Eyes: Conjunctivae are normal. PERRL. EOMI. Head: Atraumatic. Nose: No congestion/rhinnorhea. Mouth/Throat: Mucous membranes are moist.  Oropharynx non-erythematous. Neck: No stridor.   Cardiovascular: Normal rate, regular rhythm. Grossly normal heart sounds.  Good peripheral circulation. Respiratory: Normal respiratory effort.  No retractions. Lungs CTAB. Gastrointestinal: Soft and nontender. No distention. No abdominal bruits. No CVA tenderness. Genitourinary: deferred Musculoskeletal: No lower extremity tenderness nor edema.  No joint effusions. No midline T or L-spine tenderness to palpation. No tenderness to palpation throughout the right paravertebral muscles of the lumbar spine. Neurologic:  Normal speech and language. No gross focal neurologic deficits are appreciated. No gait instability. Skin:  Skin is warm, dry and intact. No rash noted. Psychiatric: Mood and affect are normal. Speech and behavior are normal.  ____________________________________________   LABS (all labs ordered are listed, but only abnormal results are displayed)  Labs Reviewed  BASIC METABOLIC PANEL - Abnormal; Notable for the following:    Glucose, Bld 111 (*)    All other components within normal limits  CBC - Abnormal; Notable for the following:    WBC 21.0 (*)    MCHC 36.7 (*)    Platelets 497 (*)    All other components within normal limits  URINALYSIS COMPLETEWITH MICROSCOPIC ([facility] ONLY) - Abnormal; Notable for the following:    Color, Urine YELLOW (*)    APPearance CLEAR (*)    All other components within normal limits  HEPATIC FUNCTION PANEL - Abnormal; Notable for the following:    Total Protein 8.3 (*)    All other components within normal limits   ____________________________________________  EKG  none ____________________________________________  RADIOLOGY  CT abdomen and pelvis  IMPRESSION: 1 mm calculus distal right ureter with slight surrounding ureteral edema. No appreciable hydronephrosis on the right. No other calculi are seen in either kidney or ureter.  Appendix appears normal. No bowel obstruction. No abscess.  Spleen and gallbladder  absent.  ____________________________________________   PROCEDURES  Procedure(s) performed: None  Critical Care performed: No  ____________________________________________   INITIAL IMPRESSION / ASSESSMENT AND PLAN / ED COURSE  Pertinent labs & imaging results that were available during my care of the patient were reviewed by me and considered in my medical decision making (see chart for details).  GERALD KUEHL is a 29 y.o. male history of anxiety, asthma, history of total splenectomy and cholecystectomy at the age of 65 though he can't remember why, lymphocytosis currently being evaluated by oncology for elevation of multiple cell lines who presents with sudden onset right colicky/cramping waxing and waning flank pain. On exam, he is in mild distress secondary to pain, pacing the room trying to find a comfortable position. Mild tachycardic which I suspect is secondary to pain. His abdominal exam is benign. Labs reviewed and are notable for acute on chronic leukocytosis, with increased white blood cell count at 21,000 and per the see his baseline appears to be 14-15,000 and),  thrombocytosis. Urinalysis is negative. We'll treat his pain, obtain CT of the abdomen and pelvis given severity of his pain, increased white blood cell count from baseline, tachycardia.  ----------------------------------------- 9:49 AM on 06/15/2015 -----------------------------------------  Pain significantly improved at this point the patient appears comfortable. He is lying in bed watching TV. Tachycardia resolved. CT scan with 1 mm right distal ureter calculus without hydronephrosis. Urinalysis is not consistent with infection. We'll discharge with expectant management, he has previously scheduled urology follow up arranged for next week. Discussed return precautions and he is comfortable with the discharge plan. ____________________________________________   FINAL CLINICAL IMPRESSION(S) / ED  DIAGNOSES  Final diagnoses:  Ureteral colic  Acute right flank pain      Joanne Gavel, MD 06/15/15 [phone]

## 2015-06-16 ENCOUNTER — Encounter: Payer: Self-pay | Admitting: Family Medicine

## 2015-06-20 ENCOUNTER — Inpatient Hospital Stay: Payer: BLUE CROSS/BLUE SHIELD | Attending: Internal Medicine | Admitting: Oncology

## 2015-06-20 ENCOUNTER — Encounter: Payer: Self-pay | Admitting: Oncology

## 2015-06-20 VITALS — BP 139/83 | HR 93 | Temp 97.7°F | Resp 20 | Ht 71.0 in | Wt 188.1 lb

## 2015-06-20 DIAGNOSIS — J45909 Unspecified asthma, uncomplicated: Secondary | ICD-10-CM

## 2015-06-20 DIAGNOSIS — D580 Hereditary spherocytosis: Secondary | ICD-10-CM | POA: Insufficient documentation

## 2015-06-20 DIAGNOSIS — Z9081 Acquired absence of spleen: Secondary | ICD-10-CM

## 2015-06-20 DIAGNOSIS — F419 Anxiety disorder, unspecified: Secondary | ICD-10-CM

## 2015-06-20 DIAGNOSIS — D473 Essential (hemorrhagic) thrombocythemia: Secondary | ICD-10-CM

## 2015-06-20 DIAGNOSIS — D72820 Lymphocytosis (symptomatic): Secondary | ICD-10-CM

## 2015-06-20 DIAGNOSIS — Z79899 Other long term (current) drug therapy: Secondary | ICD-10-CM | POA: Insufficient documentation

## 2015-06-20 DIAGNOSIS — F17210 Nicotine dependence, cigarettes, uncomplicated: Secondary | ICD-10-CM | POA: Diagnosis not present

## 2015-06-21 LAB — CBC WITH DIFFERENTIAL/PLATELET
BASOS PCT: 0 % (ref 0–1)
Band Neutrophils: 0 % (ref 0–10)
Basophils Absolute: 0 10*3/uL (ref 0.0–0.1)
Blasts: 0 %
Eosinophils Absolute: 0.5 10*3/uL (ref 0.0–0.7)
Eosinophils Relative: 2 % (ref 0–5)
HCT: 44.7 % (ref 40.0–52.0)
Hemoglobin: 16.1 g/dL (ref 13.0–18.0)
Lymphocytes Relative: 29 % (ref 12–46)
Lymphs Abs: 7.3 10*3/uL — ABNORMAL HIGH (ref 0.7–4.0)
MCH: 29.4 pg (ref 26.0–34.0)
MCHC: 36.1 g/dL — AB (ref 32.0–36.0)
MCV: 81.4 fL (ref 80.0–100.0)
Metamyelocytes Relative: 0 %
Monocytes Absolute: 2.3 10*3/uL — ABNORMAL HIGH (ref 0.1–1.0)
Monocytes Relative: 9 % (ref 3–12)
Myelocytes: 1 %
NEUTROS PCT: 59 % (ref 43–77)
Neutro Abs: 15 10*3/uL — ABNORMAL HIGH (ref 1.7–7.7)
Other: 0 %
PROMYELOCYTES ABS: 0 %
Platelets: 501 10*3/uL — ABNORMAL HIGH (ref 150–440)
RBC: 5.5 MIL/uL (ref 4.40–5.90)
RDW: 14.1 % (ref 11.5–14.5)
SMEAR REVIEW: INCREASED
WBC: 25.1 10*3/uL — ABNORMAL HIGH (ref 3.8–10.6)
nRBC: 0 /100 WBC

## 2015-06-23 LAB — COMP PANEL: LEUKEMIA/LYMPHOMA

## 2015-06-27 NOTE — Progress Notes (Signed)
[place]  [phone] [phone]  ID: Riley Obrien OB: 1986/03/22  MR#: [id number]  LHT#:[id number]  Patient Care Team: Arnetha Courser, MD as PCP - General (Family Medicine)  CHIEF COMPLAINT:  Chief Complaint  Patient presents with  . New Evaluation    thrombocytosis, lymphocytosis    INTERVAL HISTORY: Patient is a 29 year old male with a history of hereditary spherocytosis who was found to have an elevated white blood cell count and platelet count on routine evaluation. He had a splenectomy as a child for treatment of his spherocytosis. He currently feels well and is asymptomatic. He denies any recent fevers or illnesses. He has a good appetite and denies weight loss. He has no neurologic complaints. He denies any chest pain or shortness of breath. He denies any nausea, vomiting, constipation, or diarrhea. He has no urinary complaints. Patient feels at his baseline and offers no specific complaints today.   REVIEW OF SYSTEMS:   Review of Systems  Constitutional: Negative.   Respiratory: Negative.   Cardiovascular: Negative.     As per HPI. Otherwise, a complete review of systems is negatve.  PAST MEDICAL HISTORY: Past Medical History  Diagnosis Date  . Asthma   . Anxiety     PAST SURGICAL HISTORY: Past Surgical History  Procedure Laterality Date  . Splenectomy, total    . Cholecystectomy      FAMILY HISTORY Family History  Problem Relation Age of Onset  . Diabetes Maternal Grandmother   . Alzheimer's disease Paternal Grandmother        ADVANCED DIRECTIVES:    HEALTH MAINTENANCE: History  Substance Use Topics  . Smoking status: Current Every Day Smoker -- 1.00 packs/day for 14 years    Types: Cigarettes  . Smokeless tobacco: Never Used  . Alcohol Use: No     Colonoscopy:  PAP:  Bone density:  Lipid panel:  No Known Allergies  Current Outpatient Prescriptions  Medication Sig Dispense Refill  . buPROPion  (WELLBUTRIN) 75 MG tablet Take 75 mg by mouth 2 (two) times daily.     . busPIRone (BUSPAR) 10 MG tablet Take 10 mg by mouth 3 (three) times daily.     . Cholecalciferol (VITAMIN D3) 5000 UNITS TABS Take 5,000 Units by mouth once a week.    . ondansetron (ZOFRAN ODT) 4 MG disintegrating tablet Take 1 tablet (4 mg total) by mouth every 8 (eight) hours as needed for nausea or vomiting. (Patient not taking: Reported on 06/20/2015) 12 tablet 0  . oxyCODONE (ROXICODONE) 5 MG immediate release tablet Take 1 tablet (5 mg total) by mouth every 6 (six) hours as needed for moderate pain. Do not drive while taking this medication. (Patient not taking: Reported on 06/20/2015) 12 tablet 0  . tamsulosin (FLOMAX) 0.4 MG CAPS capsule Take 1 capsule (0.4 mg total) by mouth daily. (Patient not taking: Reported on 06/20/2015) 14 capsule 0  . vitamin B-12 (CYANOCOBALAMIN) 1000 MCG tablet Take 1,000 mcg by mouth daily.     No current facility-administered medications for this visit.    OBJECTIVE: Filed Vitals:   06/20/15 0917  BP: 139/83  Pulse: 93  Temp: 97.7 F (36.5 C)  Resp: 20     Body mass index is 26.24 kg/(m^2).    ECOG FS:0 - Asymptomatic  General: Well-developed, well-nourished, no acute distress. Eyes: Pink conjunctiva, anicteric sclera. HEENT: Normocephalic, moist mucous membranes, clear oropharnyx. Lungs: Clear to auscultation bilaterally. Heart: Regular rate and rhythm. No rubs, murmurs, or gallops.  Abdomen: Soft, nontender, nondistended. No organomegaly noted, normoactive bowel sounds. Musculoskeletal: No edema, cyanosis, or clubbing. Neuro: Alert, answering all questions appropriately. Cranial nerves grossly intact. Skin: No rashes or petechiae noted. Psych: Normal affect. Lymphatics: No cervical, calvicular, axillary or inguinal LAD.   LAB RESULTS:  Lab Results  Component Value Date   NA 135 06/15/2015   K 3.6 06/15/2015   CL 102 06/15/2015   CO2 22 06/15/2015   GLUCOSE 111*  06/15/2015   BUN 8 06/15/2015   CREATININE 0.90 06/15/2015   CALCIUM 9.2 06/15/2015   PROT 8.3* 06/15/2015   ALBUMIN 4.5 06/15/2015   AST 27 06/15/2015   ALT 30 06/15/2015   ALKPHOS 78 06/15/2015   BILITOT 0.7 06/15/2015   GFRNONAA >60 06/15/2015   GFRAA >60 06/15/2015    Lab Results  Component Value Date   WBC 25.1* 06/20/2015   NEUTROABS 15.0* 06/20/2015   HGB 16.1 06/20/2015   HCT 44.7 06/20/2015   MCV 81.4 06/20/2015   PLT 501* 06/20/2015     STUDIES: Ct Abdomen Pelvis W Contrast  06/15/2015   CLINICAL DATA:  Acute onset right-sided flank pain  EXAM: CT ABDOMEN AND PELVIS WITH CONTRAST  TECHNIQUE: Multidetector CT imaging of the abdomen and pelvis was performed using the standard protocol following bolus administration of intravenous contrast. Oral contrast was also administered.  CONTRAST:  139mL OMNIPAQUE IOHEXOL 300 MG/ML  SOLN  COMPARISON:  August 09, 2010  FINDINGS: Lung bases are clear.  No focal liver lesions are identified. Gallbladder is surgically absent. There is no appreciable biliary duct dilatation.  The spleen is surgically absent. Pancreas and adrenals appear normal.  Kidneys bilaterally show no mass or hydronephrosis on either side. There is no perinephric stranding on either side. There is no renal calculus on either side. On axial slice 83 series 2, there is a 1 mm calculus in the distal right ureter with mild surrounding ureteral edema.  In the pelvis, the urinary bladder is midline with normal wall thickness. There is no pelvic mass or pelvic fluid collection. The appendix appears normal.  There is no bowel obstruction. No free air or portal venous air. There is no appreciable ascites, adenopathy, or abscess in the abdomen or pelvis. There is mild atherosclerotic change in the distal aorta and proximal common iliac arteries. There is no abdominal aortic aneurysm. There are no blastic or lytic bone lesions.  IMPRESSION: 1 mm calculus distal right ureter with  slight surrounding ureteral edema. No appreciable hydronephrosis on the right. No other calculi are seen in either kidney or ureter.  Appendix appears normal.  No bowel obstruction.  No abscess.  Spleen and gallbladder absent.   Electronically Signed   By: Lowella Grip III M.D.   On: 06/15/2015 09:40    ASSESSMENT: Hereditary spherocytosis.  PLAN:    1. Hereditary spherocytosis: Patient had splenectomy as a child for treatment of his underlying disorder. He likely has a baseline elevated white blood cell count and platelet count because of this. For completeness a peripheral blood flow cytometry was done and is negative. The remainder of his laboratory work is also either negative or within normal limits. No intervention is needed at this time. I suspect his mild elevation is approximately patient's baseline. Continue to monitor CBC 1-2 times per year and if he does have an increase in his blood counts or become symptomatic he can be referred back for further evaluation. No follow-up has been scheduled at this time.  Patient expressed understanding  and was in agreement with this plan. He also understands that He can call clinic at any time with any questions, concerns, or complaints.     Lloyd Huger, MD   06/27/2015 1:29 PM

## 2015-06-28 ENCOUNTER — Encounter: Payer: Self-pay | Admitting: Urology

## 2015-06-28 ENCOUNTER — Ambulatory Visit (INDEPENDENT_AMBULATORY_CARE_PROVIDER_SITE_OTHER): Payer: BLUE CROSS/BLUE SHIELD | Admitting: Urology

## 2015-06-28 VITALS — BP 130/93 | HR 86 | Ht 71.0 in | Wt 187.8 lb

## 2015-06-28 DIAGNOSIS — N201 Calculus of ureter: Secondary | ICD-10-CM

## 2015-06-28 DIAGNOSIS — E291 Testicular hypofunction: Secondary | ICD-10-CM

## 2015-06-28 HISTORY — DX: Testicular hypofunction: E29.1

## 2015-06-28 HISTORY — DX: Calculus of ureter: N20.1

## 2015-06-28 LAB — URINALYSIS, COMPLETE
BILIRUBIN UA: NEGATIVE
Glucose, UA: NEGATIVE
Ketones, UA: NEGATIVE
Leukocytes, UA: NEGATIVE
NITRITE UA: NEGATIVE
PH UA: 6 (ref 5.0–7.5)
Protein, UA: NEGATIVE
RBC UA: NEGATIVE
Specific Gravity, UA: 1.015 (ref 1.005–1.030)
Urobilinogen, Ur: 0.2 mg/dL (ref 0.2–1.0)

## 2015-06-28 LAB — MICROSCOPIC EXAMINATION
Bacteria, UA: NONE SEEN
RBC MICROSCOPIC, UA: NONE SEEN /HPF (ref 0–?)

## 2015-06-28 NOTE — Progress Notes (Signed)
06/28/2015 11:33 AM   Riley Obrien [birth year] [id number]  Referring provider: Arnetha Courser, MD [address] [place], [place] [address]  Chief Complaint  Patient presents with  . Low Testosterone    referral from [facility], patient also recently seen in the ER for kidney stone    HPI: Riley Obrien is a 29 year old white male who is referred to [place] by his primary care physician, Dr. Sanda Klein for hypogonadism.  Patient stated he was talking with some friends about his low sex drive and his friends advised him to have his testosterone level checked. His free testosterone was evaluated and returned low.  He is also having symptoms of loss of height. He states in the past he was 6'1" and now he is 5'10". He is moody, has persistent feeling of sadness and has been diagnosed with depression.        Androgen Deficiency in the Aging Male      06/28/15 1100       Androgen Deficiency in the Aging Male   Do you have a decrease in libido (sex drive) Yes     Do you have lack of energy Yes     Do you have a decrease in strength and/or endurance Yes     Have you lost height Yes     Have you noticed a decreased "enjoyment of life" Yes     Are you sad and/or grumpy Yes     Are your erections less strong Yes     Have you noticed a recent deterioration in your ability to play sports No     Are you falling asleep after dinner No     Has there been a recent deterioration in your work performance No         Incidentally, he was seen in the ED on 06/15/2015 for right sided flank pain and it was found that he had a 1 mm calculus distal right ureter with slight surrounding ureteral edema.  Patient does not have a prior history of stones not does he report any gross hematuria.  He has not had any recent right flank pain, but he does not recall passing a definite stone.  His UA is unremarkable today.    PMH: Past Medical History  Diagnosis Date  . Asthma   . Anxiety   . Depression   .  Heartburn   . History of kidney stones     recently first one seen in ER last of July 2016    Surgical History: Past Surgical History  Procedure Laterality Date  . Splenectomy, total    . Cholecystectomy      Home Medications:    Medication List       This list is accurate as of: 06/28/15 11:33 AM.  Always use your most recent med list.               buPROPion 75 MG tablet  Commonly known as:  WELLBUTRIN  Take 75 mg by mouth 2 (two) times daily.     busPIRone 10 MG tablet  Commonly known as:  BUSPAR  Take 10 mg by mouth 3 (three) times daily.     ondansetron 4 MG disintegrating tablet  Commonly known as:  ZOFRAN ODT  Take 1 tablet (4 mg total) by mouth every 8 (eight) hours as needed for nausea or vomiting.     oxyCODONE 5 MG immediate release tablet  Commonly known as:  ROXICODONE  Take  1 tablet (5 mg total) by mouth every 6 (six) hours as needed for moderate pain. Do not drive while taking this medication.     tamsulosin 0.4 MG Caps capsule  Commonly known as:  FLOMAX  Take 1 capsule (0.4 mg total) by mouth daily.     vitamin B-12 1000 MCG tablet  Commonly known as:  CYANOCOBALAMIN  Take 1,000 mcg by mouth daily.     Vitamin D3 5000 UNITS Tabs  Take 5,000 Units by mouth once a week.        Allergies: No Known Allergies  Family History: Family History  Problem Relation Age of Onset  . Diabetes Maternal Grandmother   . Alzheimer's disease Paternal Grandmother   . Kidney disease Neg Hx   . Prostate cancer Neg Hx     Social History:  reports that he has been smoking Cigarettes.  He has a 14 pack-year smoking history. He has never used smokeless tobacco. He reports that he does not drink alcohol or use illicit drugs.  ROS: UROLOGY Frequent Urination?: Yes Hard to postpone urination?: No Burning/pain with urination?: No Get up at night to urinate?: No Leakage of urine?: No Urine stream starts and stops?: No Trouble starting stream?: No Do you have  to strain to urinate?: No Blood in urine?: No Urinary tract infection?: No Sexually transmitted disease?: No Injury to kidneys or bladder?: No Painful intercourse?: No Weak stream?: No Erection problems?: No Penile pain?: No  Gastrointestinal Nausea?: No Vomiting?: Yes Indigestion/heartburn?: Yes Diarrhea?: No Constipation?: No  Constitutional Fever: No Night sweats?: No Weight loss?: No Fatigue?: No  Skin Skin rash/lesions?: No Itching?: No  Eyes Blurred vision?: No Double vision?: No  Ears/Nose/Throat Sore throat?: No Sinus problems?: No  Hematologic/Lymphatic Swollen glands?: No Easy bruising?: No  Cardiovascular Leg swelling?: No Chest pain?: No  Respiratory Cough?: No Shortness of breath?: No  Endocrine Excessive thirst?: Yes  Musculoskeletal Back pain?: No Joint pain?: No  Neurological Headaches?: No Dizziness?: No  Psychologic Depression?: Yes Anxiety?: Yes  Physical Exam: BP 130/93 mmHg  Pulse 86  Ht 5\' 11"  (1.803 m)  Wt 187 lb 12.8 oz (85.186 kg)  BMI 26.20 kg/m2  Constitutional:  Alert and oriented, No acute distress. HEENT: [place] AT, moist mucus membranes.  Trachea midline, no masses. Cardiovascular: No clubbing, cyanosis, or edema. Respiratory: Normal respiratory effort, no increased work of breathing. GI: Abdomen is soft, nontender, nondistended, no abdominal masses GU: No CVA tenderness. GU: Patient with circumcised phallus.  Urethral meatus is patent.  No penile discharge. No penile lesions or rashes. Scrotum without lesions, cysts, rashes and/or edema.  Testicles are located scrotally bilaterally. No masses are appreciated in the testicles. Left and right epididymis are normal. Rectal: Patient with  normal sphincter tone. Perineum without scarring or rashes. No rectal masses are appreciated. Prostate is approximately 35 grams, no nodules are appreciated. Seminal vesicles are normal. Skin: No rashes, bruises or suspicious  lesions. Lymph: No cervical or inguinal adenopathy. Neurologic: Grossly intact, no focal deficits, moving all 4 extremities. Psychiatric: Normal mood and affect.  Laboratory Data: Results for orders placed or performed in visit on 06/28/15  Microscopic Examination  Result Value Ref Range   WBC, UA 0-5 0 -  5 /hpf   RBC, UA None seen 0 -  2 /hpf   Epithelial Cells (non renal) 0-10 0 - 10 /hpf   Bacteria, UA None seen None seen/Few  Urinalysis, Complete  Result Value Ref Range   Specific Gravity, UA  1.015 1.005 - 1.030   pH, UA 6.0 5.0 - 7.5   Color, UA Yellow Yellow   Appearance Ur Clear Clear   Leukocytes, UA Negative Negative   Protein, UA Negative Negative/Trace   Glucose, UA Negative Negative   Ketones, UA Negative Negative   RBC, UA Negative Negative   Bilirubin, UA Negative Negative   Urobilinogen, Ur 0.2 0.2 - 1.0 mg/dL   Nitrite, UA Negative Negative   Microscopic Examination See below:    Lab Results  Component Value Date   WBC 25.1* 06/20/2015   HGB 16.1 06/20/2015   HCT 44.7 06/20/2015   MCV 81.4 06/20/2015   PLT 501* 06/20/2015    Lab Results  Component Value Date   CREATININE 0.90 06/15/2015    No results found for: PSA  No results found for: TESTOSTERONE  No results found for: HGBA1C  Urinalysis    Component Value Date/Time   COLORURINE YELLOW* 06/15/2015 0734   APPEARANCEUR CLEAR* 06/15/2015 0734   LABSPEC 1.015 06/15/2015 0734   PHURINE 6.0 06/15/2015 [place] 06/15/2015 0734   HGBUR NEGATIVE 06/15/2015 0734   BILIRUBINUR NEGATIVE 06/15/2015 0734   KETONESUR NEGATIVE 06/15/2015 0734   PROTEINUR NEGATIVE 06/15/2015 0734   NITRITE NEGATIVE 06/15/2015 0734   LEUKOCYTESUR NEGATIVE 06/15/2015 0734    Pertinent Imaging: CLINICAL DATA: Acute onset right-sided flank pain  EXAM: CT ABDOMEN AND PELVIS WITH CONTRAST  TECHNIQUE: Multidetector CT imaging of the abdomen and pelvis was performed using the standard protocol  following bolus administration of intravenous contrast. Oral contrast was also administered.  CONTRAST: 194mL OMNIPAQUE IOHEXOL 300 MG/ML SOLN  COMPARISON: August 09, 2010  FINDINGS: Lung bases are clear.  No focal liver lesions are identified. Gallbladder is surgically absent. There is no appreciable biliary duct dilatation.  The spleen is surgically absent. Pancreas and adrenals appear normal.  Kidneys bilaterally show no mass or hydronephrosis on either side. There is no perinephric stranding on either side. There is no renal calculus on either side. On axial slice 83 series 2, there is a 1 mm calculus in the distal right ureter with mild surrounding ureteral edema.  In the pelvis, the urinary bladder is midline with normal wall thickness. There is no pelvic mass or pelvic fluid collection. The appendix appears normal.  There is no bowel obstruction. No free air or portal venous air. There is no appreciable ascites, adenopathy, or abscess in the abdomen or pelvis. There is mild atherosclerotic change in the distal aorta and proximal common iliac arteries. There is no abdominal aortic aneurysm. There are no blastic or lytic bone lesions.  IMPRESSION: 1 mm calculus distal right ureter with slight surrounding ureteral edema. No appreciable hydronephrosis on the right. No other calculi are seen in either kidney or ureter.  Appendix appears normal. No bowel obstruction. No abscess.  Spleen and gallbladder absent.   Electronically Signed  By: Lowella Grip III M.D.  On: 06/15/2015 09:40  Assessment & Plan:   1. Ureteral stone:    Patient has not passed a specific stone.   He has not had any further pain or is there blood in his urine today.  We will obtain a RUS in one month.    - Urinalysis, Complete  2. Hypogonadism:   I explained to patient that hypogonadism is diagnosed after 2 morning serum testosterone draws before 9 AM on 2 separate  occasions returned below the laboratories parameter for normal testosterone. He is reporting difficulty with erections, but I cautioned  him that testosterone therapy is not the treatment for erectile dysfunction.   He is also reporting a loss of height, moodiness, sadness and is diagnosed with depression.  I discussed with the patient the side effects of testosterone therapy, such as: enlargement of the prostate gland that may in turn cause LUTS, possible increased risk of PCa, DVT's and/or PE's, possible increased risk of heart attack or stroke, lower sperm count, swelling of the ankles, feet, or body, with or without heart failure, enlarged or painful breasts, have problems breathing while you sleep (sleep apnea), increased prostate specific antigen, mood swings, hypertension and increased red blood cell count.  He will return in the morning before 9 AM for his first serum testosterone draw. He will then return next week for his second serum testosterone draw before 9 AM.   No Follow-up on file.  Zara Council, [facility] [address] [place], [place] [address] [phone]

## 2015-07-01 ENCOUNTER — Other Ambulatory Visit: Payer: BLUE CROSS/BLUE SHIELD

## 2015-07-01 DIAGNOSIS — E291 Testicular hypofunction: Secondary | ICD-10-CM

## 2015-07-02 LAB — TESTOSTERONE: Testosterone: 309 ng/dL — ABNORMAL LOW (ref 348–1197)

## 2015-07-05 ENCOUNTER — Other Ambulatory Visit: Payer: BLUE CROSS/BLUE SHIELD

## 2015-07-08 ENCOUNTER — Other Ambulatory Visit: Payer: BLUE CROSS/BLUE SHIELD

## 2015-07-08 DIAGNOSIS — R5383 Other fatigue: Secondary | ICD-10-CM

## 2015-07-10 LAB — TESTOSTERONE,FREE AND TOTAL: Testosterone, Free: 9.5 pg/mL (ref 9.3–26.5)

## 2015-07-11 ENCOUNTER — Telehealth: Payer: Self-pay

## 2015-07-11 DIAGNOSIS — N200 Calculus of kidney: Secondary | ICD-10-CM

## 2015-07-11 NOTE — Telephone Encounter (Signed)
-----   Message from Nori Riis, PA-C sent at 07/10/2015 10:24 PM EDT ----- Patient's testosterone is low.  He needs another total testosterone before 9:00 am.  Dr. Sissy Hoff from last week only had a free testosterone.  We need a total.

## 2015-07-11 NOTE — Telephone Encounter (Signed)
LMOM

## 2015-07-12 NOTE — Telephone Encounter (Signed)
Spoke with pt in reference to testosterone. Pt stated he came to [facility] for both testosterone lab draws. Nurse called [facility] and results are going to be faxed again.

## 2015-07-12 NOTE — Telephone Encounter (Signed)
-----   Message from Nori Riis, PA-C sent at 07/10/2015 10:24 PM EDT ----- Patient's testosterone is low.  He needs another total testosterone before 9:00 am.  Dr. Sissy Hoff from last week only had a free testosterone.  We need a total.

## 2015-07-13 NOTE — Telephone Encounter (Signed)
Patient has had two low serum testosterone.  We can start testosterone therapy if the patient is wanting to pursue treatment.

## 2015-07-20 ENCOUNTER — Other Ambulatory Visit: Payer: Self-pay | Admitting: Family Medicine

## 2015-08-05 NOTE — Telephone Encounter (Signed)
He will need an office visit to discuss treatment options and we will also have to obtain a RUS because of his past history of stones.

## 2015-08-05 NOTE — Telephone Encounter (Signed)
LMOM for pt to call back to make an appt. Orders for RUS placed.

## 2015-08-05 NOTE — Telephone Encounter (Signed)
Spoke with pt who stated he does want to pursue treatment. Please advise.

## 2015-08-09 ENCOUNTER — Ambulatory Visit
Admission: RE | Admit: 2015-08-09 | Discharge: 2015-08-09 | Disposition: A | Discharge status: Home / Self Care | Payer: BLUE CROSS/BLUE SHIELD | Source: Ambulatory Visit | Attending: Urology | Admitting: Urology

## 2015-08-09 DIAGNOSIS — N200 Calculus of kidney: Secondary | ICD-10-CM | POA: Diagnosis not present

## 2015-08-15 ENCOUNTER — Encounter: Payer: Self-pay | Admitting: Urology

## 2015-08-15 ENCOUNTER — Ambulatory Visit (INDEPENDENT_AMBULATORY_CARE_PROVIDER_SITE_OTHER): Payer: BLUE CROSS/BLUE SHIELD | Admitting: Urology

## 2015-08-15 VITALS — BP 125/85 | HR 105 | Ht 72.0 in | Wt 189.9 lb

## 2015-08-15 DIAGNOSIS — E291 Testicular hypofunction: Secondary | ICD-10-CM | POA: Diagnosis not present

## 2015-08-15 MED ORDER — CLOMIPHENE CITRATE 50 MG PO TABS: ORAL_TABLET | ORAL | Status: DC

## 2015-08-15 NOTE — Progress Notes (Signed)
08/15/2015 9:13 AM   Riley Obrien October 23, 1986 [id number]  Referring provider: Arnetha Courser, MD [address] [place], [place] [address]  Chief Complaint  Patient presents with  . Hypogonadism    HPI: Patient is a 29 year old white male who was found to be hypogonadal with 2 separate morning serum testosterone levels drawn, 309 ng/dL on 07/01/2015 and 289 ng/dL on 07/08/2015.  He presents today to discuss his treatment options.  His FSH/LH and prolactin are drawn today.  He is hoping to receive more energy, improvement in his depression and anxiety and improvement in his irritability with testosterone therapy.  He is concerned about fertility issues and would like to have the option to have children in the future.  PMH: Past Medical History  Diagnosis Date  . Asthma   . Anxiety   . Depression   . Heartburn   . History of kidney stones     recently first one seen in ER last of July 2016  . Hypogonadism male   . B12 deficiency 05/30/2015  . Dental caries 06/09/2015  . 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 Polyuria 05/26/2015  . Ureteral stone 06/28/2015  . Thrombocytosis 05/30/2015  . 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 Need for vaccination with combined diphtheria-tetanus-pertussis (DTaP) 05/26/2015    Given today; tetanus part good for 10 years, whooping cough good for life     Surgical History: Past Surgical History  Procedure Laterality Date  . Splenectomy, total    . Cholecystectomy      Home Medications:    Medication List       This list is accurate as of: 08/15/15  9:13 AM.  Always use your most recent med list.               buPROPion 75 MG tablet  Commonly known as:  WELLBUTRIN  Take 75 mg by mouth 2 (two) times daily.     busPIRone 10 MG tablet  Commonly known as:  BUSPAR  Take 10 mg by mouth 3 (three) times daily.     divalproex 500 MG 24 hr tablet  Commonly known as:  DEPAKOTE ER     ondansetron 4 MG disintegrating tablet  Commonly known as:  ZOFRAN ODT    Take 1 tablet (4 mg total) by mouth every 8 (eight) hours as needed for nausea or vomiting.     oxyCODONE 5 MG immediate release tablet  Commonly known as:  ROXICODONE  Take 1 tablet (5 mg total) by mouth every 6 (six) hours as needed for moderate pain. Do not drive while taking this medication.     QUEtiapine 200 MG tablet  Commonly known as:  SEROQUEL     tamsulosin 0.4 MG Caps capsule  Commonly known as:  FLOMAX  Take 1 capsule (0.4 mg total) by mouth daily.     vitamin B-12 1000 MCG tablet  Commonly known as:  CYANOCOBALAMIN  Take 1,000 mcg by mouth daily.     Vitamin D3 5000 UNITS Tabs  Take 5,000 Units by mouth once a week.        Allergies: No Known Allergies  Family History: Family History  Problem Relation Age of Onset  . Diabetes Maternal Grandmother   . Alzheimer's disease Paternal Grandmother   . Kidney disease Neg Hx   . Prostate cancer Neg Hx     Social History:  reports that he has been smoking Cigarettes.  He has a 14 pack-year smoking history. He has never  used smokeless tobacco. He reports that he does not drink alcohol or use illicit drugs.  ROS: UROLOGY Frequent Urination?: No Hard to postpone urination?: No Burning/pain with urination?: No Get up at night to urinate?: No Leakage of urine?: No Urine stream starts and stops?: No Trouble starting stream?: No Do you have to strain to urinate?: No Blood in urine?: No Urinary tract infection?: No Sexually transmitted disease?: No Injury to kidneys or bladder?: No Painful intercourse?: No Weak stream?: No Erection problems?: No Penile pain?: No  Gastrointestinal Nausea?: No Vomiting?: No Indigestion/heartburn?: No Diarrhea?: No Constipation?: No  Constitutional Fever: No Night sweats?: No Weight loss?: No Fatigue?: No  Skin Skin rash/lesions?: No  Eyes Blurred vision?: No Double vision?: No  Ears/Nose/Throat Sore throat?: No Sinus problems?:  No  Hematologic/Lymphatic Swollen glands?: No Easy bruising?: No  Cardiovascular Leg swelling?: No Chest pain?: No  Respiratory Cough?: No Shortness of breath?: No  Endocrine Excessive thirst?: Yes  Musculoskeletal Back pain?: No Joint pain?: No  Neurological Headaches?: No Dizziness?: No  Psychologic Depression?: Yes Anxiety?: Yes  Physical Exam: BP 125/85 mmHg  Pulse 105  Ht 6' (1.829 m)  Wt 189 lb 14.4 oz (86.138 kg)  BMI 25.75 kg/m2   Laboratory Data: Lab Results  Component Value Date   WBC 25.1* 06/20/2015   HGB 16.1 06/20/2015   HCT 44.7 06/20/2015   MCV 81.4 06/20/2015   PLT 501* 06/20/2015   Lab Results  Component Value Date   CREATININE 0.90 06/15/2015   Lab Results  Component Value Date   TESTOSTERONE 309* 07/01/2015    Assessment & Plan:    1. Hypogonadism in male:   I discussed with the patient the side effects of testosterone therapy, such as: enlargement of the prostate gland that may in turn cause LUTS, possible increased risk of PCa, DVT's and/or PE's, possible increased risk of heart attack or stroke, lower sperm count, swelling of the ankles, feet, or body, with or without heart failure, enlarged or painful breasts, have problems breathing while you sleep (sleep apnea), increased prostate specific antigen, mood swings, hypertension and increased red blood cell count.  I also discussed that some men have had success using clomid for hypogonadism.  It does seem to be more successful in younger men, but there are incidences of good results in middle-aged men.  I explained that it is used in male infertility to stimulate the testicles to make more testosterone/sperm.  There has been no long term data on side effects, but some urologists has been having success with this medication.   We decided that Clomid would be a better option for him at this time and effort to preserve fertility.  I have sent a prescription for Clomid 50 mg half a  tablet daily to his pharmacy.  He will return in one month in the morning before 9 AM for a serum testosterone draw.   - FSH/LH - Prolactin   No Follow-up on file.  Zara Council, [facility] [address] [place], [place] [address] [phone]

## 2015-08-16 LAB — PROLACTIN: PROLACTIN: 6.7 ng/mL (ref 4.0–15.2)

## 2015-08-16 LAB — FSH/LH
FSH: 9.5 m[IU]/mL (ref 1.5–12.4)
LH: 10.2 m[IU]/mL — ABNORMAL HIGH (ref 1.7–8.6)

## 2015-08-24 LAB — BCR-ABL1 KINASE DOMAIN MUTATION ANALYSIS

## 2015-09-06 ENCOUNTER — Ambulatory Visit (INDEPENDENT_AMBULATORY_CARE_PROVIDER_SITE_OTHER): Payer: BLUE CROSS/BLUE SHIELD | Admitting: Family Medicine

## 2015-09-06 ENCOUNTER — Encounter: Payer: Self-pay | Admitting: Family Medicine

## 2015-09-06 VITALS — BP 128/71 | HR 92 | Temp 99.2°F | Ht 70.0 in | Wt 185.0 lb

## 2015-09-06 DIAGNOSIS — J029 Acute pharyngitis, unspecified: Secondary | ICD-10-CM | POA: Diagnosis not present

## 2015-09-06 DIAGNOSIS — J020 Streptococcal pharyngitis: Secondary | ICD-10-CM | POA: Diagnosis not present

## 2015-09-06 DIAGNOSIS — K069 Disorder of gingiva and edentulous alveolar ridge, unspecified: Secondary | ICD-10-CM

## 2015-09-06 DIAGNOSIS — D72820 Lymphocytosis (symptomatic): Secondary | ICD-10-CM

## 2015-09-06 DIAGNOSIS — D72829 Elevated white blood cell count, unspecified: Secondary | ICD-10-CM | POA: Diagnosis not present

## 2015-09-06 DIAGNOSIS — K029 Dental caries, unspecified: Secondary | ICD-10-CM | POA: Diagnosis not present

## 2015-09-06 LAB — CBC WITH DIFFERENTIAL/PLATELET
HEMATOCRIT: 45.6 % (ref 37.5–51.0)
Hemoglobin: 16.7 g/dL (ref 12.6–17.7)
Lymphocytes Absolute: 3.5 10*3/uL — ABNORMAL HIGH (ref 0.7–3.1)
Lymphs: 15 %
MCH: 31.2 pg (ref 26.6–33.0)
MCHC: 36.6 g/dL — ABNORMAL HIGH (ref 31.5–35.7)
MCV: 85 fL (ref 79–97)
MID (ABSOLUTE): 3.2 10*3/uL — AB (ref 0.1–1.6)
MID: 14 %
Neutrophils Absolute: 16.4 10*3/uL — ABNORMAL HIGH (ref 1.4–7.0)
Neutrophils: 71 %
PLATELETS: 375 10*3/uL (ref 150–379)
RBC: 5.36 x10E6/uL (ref 4.14–5.80)
RDW: 14.5 % (ref 12.3–15.4)
WBC: 23.1 10*3/uL (ref 3.4–10.8)

## 2015-09-06 LAB — STREP GP A AG, IA W/REFLEX: STREP GP A AG, IA W/REFLEX: POSITIVE — AB

## 2015-09-06 MED ORDER — PENICILLIN V POTASSIUM 500 MG PO TABS: 500.0000 mg | ORAL_TABLET | Freq: Three times a day (TID) | ORAL | Status: AC

## 2015-09-06 NOTE — Patient Instructions (Signed)
Start the new antibiotics Finish out the whole course Please do eat yogurt daily or take a probiotic daily for the next month or two We want to replace the healthy germs in the gut If you notice foul, watery diarrhea in the next two months, schedule an appointment RIGHT AWAY Out of work today and tomorrow Call if not feeling better over the next 48 hours We'll refer you to an oral surgeon for your teeth

## 2015-09-06 NOTE — Progress Notes (Signed)
BP 128/71 mmHg  Pulse 92  Temp(Src) 99.2 F (37.3 C)  Ht 5\' 10"  (1.778 m)  Wt 185 lb (83.915 kg)  BMI 26.54 kg/m2  SpO2 97%   Subjective:    Patient ID: Riley Obrien, male    DOB: 17-Sep-1986, 29 y.o.   MRN: [id number]  HPI: BUSTER SCHUELLER is a 29 y.o. male  Chief Complaint  Patient presents with  . Follow-up    he says he is just here for a 3 month follow up  . URI    he states he has a cold  . Referral    he'd like to get a referral to go somewhere for dental surgery   He is sick today; yesterday he was fine; runny nose, main problem; throat was hurting yesterday, but today that is better; sneezing and coughing every now and then; no fever; just started yesterday; works third shift, in and out of the rain and cold; no visits to hospital or nursing home; few aches but not bad; he has been taking cold and flu medicine, alka seltzer to help with breathing; lots of postnasal drip; sinuses are congested; ears are okay; no rash; no travel  He just got started on Clomid by the urologist; he chose that over the testosterone; he will follow-up in another week or two; no side effects; having an increase in energy already  His teeth need attention; he believes his teeth are a medical problem, not a cosmetic problem; he has low grade fevers, elevated white count  He is seeing psychiatrist for his mood disorder  Relevant past medical, surgical, family and social history reviewed and updated as indicated. Interim medical history since our last visit reviewed. Allergies and medications reviewed and updated.  Review of Systems  Constitutional: Positive for fever, diaphoresis and fatigue.  HENT: Positive for sore throat.   Hematological: Positive for adenopathy.  Per HPI unless specifically indicated above     Objective:    BP 128/71 mmHg  Pulse 92  Temp(Src) 99.2 F (37.3 C)  Ht 5\' 10"  (1.778 m)  Wt 185 lb (83.915 kg)  BMI 26.54 kg/m2  SpO2 97%  Wt Readings from Last 3  Encounters:  09/06/15 185 lb (83.915 kg)  08/15/15 189 lb 14.4 oz (86.138 kg)  06/28/15 187 lb 12.8 oz (85.186 kg)    Physical Exam  Constitutional: He appears well-developed and well-nourished. No distress.  HENT:  Head: Normocephalic and atraumatic.  Right Ear: External ear and ear canal normal.  Left Ear: External ear and ear canal normal.  Nose: Rhinorrhea present. No mucosal edema.  Mouth/Throat: Mucous membranes are not pale and not dry. Abnormal dentition. Dental caries present. Posterior oropharyngeal edema and posterior oropharyngeal erythema present. No oropharyngeal exudate or tonsillar abscesses.  Eyes: EOM are normal. No scleral icterus.  Neck: No thyromegaly present.  Cardiovascular: Normal rate and regular rhythm.   Pulmonary/Chest: Effort normal and breath sounds normal.  Abdominal: He exhibits no distension.  Musculoskeletal: He exhibits no edema.  Lymphadenopathy:    He has cervical adenopathy.  Neurological: Coordination normal.  Skin: Skin is warm and dry. No pallor.  Psychiatric: His behavior is normal. Judgment and thought content normal. His affect is blunt.  Fair eye contact with examiner    Results for orders placed or performed in visit on 09/06/15  Strep Gp A Ag, IA W/Reflex  Result Value Ref Range   Strep Gp A Ag, IA W/Reflex Positive (A) Negative  CBC With Differential/Platelet  Result Value Ref Range   WBC 23.1 (HH) 3.4 - 10.8 x10E3/uL   RBC 5.36 4.14 - 5.80 x10E6/uL   Hemoglobin 16.7 12.6 - 17.7 g/dL   Hematocrit 45.6 37.5 - 51.0 %   MCV 85 79 - 97 fL   MCH 31.2 26.6 - 33.0 pg   MCHC 36.6 (H) 31.5 - 35.7 g/dL   RDW 14.5 12.3 - 15.4 %   Platelets 375 150 - 379 x10E3/uL   Neutrophils 71 %   Lymphs 15 %   MID 14 %   Neutrophils Absolute 16.4 (H) 1.4 - 7.0 x10E3/uL   Lymphocytes Absolute 3.5 (H) 0.7 - 3.1 x10E3/uL   MID (Absolute) 3.2 (H) 0.1 - 1.6 X10E3/uL      Assessment & Plan:   Problem List Items Addressed This Visit       Respiratory   Acute pharyngitis - Primary   Relevant Orders   Mononucleosis screen (STAT)   Strep Gp A Ag, IA W/Reflex (Completed)   CBC With Differential/Platelet (STAT)     Digestive   Dental caries    Refer to oral surgeon      Gum disease    Refer to oral surgeon; request patient be evaluated to see if dental extraction considered medically necessary with his elevated white count, low-grade fevers, given what appears to be chronic infection      RESOLVED: Chronic gum disease   Relevant Orders   Ambulatory referral to Oral Maxillofacial Surgery     Other   Lymphocytosis    Patient has been seen hematologist at the cancer center for this; I am having staff fax over a copy of today's CBC with diff for his review, with note that this is with current strep infection      RESOLVED: Elevated white blood cell count   Relevant Orders   Ambulatory referral to Oral Maxillofacial Surgery    Other Visit Diagnoses    Streptococcal sore throat        rapid strep was positive; discussed dx with patient, start antibiotics       Follow up plan: Return if symptoms worsen or fail to improve.  Meds ordered this encounter  Medications  . penicillin v potassium (VEETID) 500 MG tablet    Sig: Take 1 tablet (500 mg total) by mouth 3 (three) times daily.    Dispense:  30 tablet    Refill:  0   An after-visit summary was printed and given to the patient at [facility].  Please see the patient instructions which may contain other information and recommendations beyond what is mentioned above in the assessment and plan.

## 2015-09-09 DIAGNOSIS — D72829 Elevated white blood cell count, unspecified: Secondary | ICD-10-CM | POA: Insufficient documentation

## 2015-09-09 NOTE — Assessment & Plan Note (Signed)
Refer to oral surgeon; request patient be evaluated to see if dental extraction considered medically necessary with his elevated white count, low-grade fevers, given what appears to be chronic infection

## 2015-09-09 NOTE — Assessment & Plan Note (Signed)
Refer to oral surgeon 

## 2015-09-09 NOTE — Assessment & Plan Note (Signed)
Patient has been seen hematologist at the cancer [facility] for this; I am having staff fax over a copy of today's CBC with diff for his review, with note that this is with current strep infection

## 2015-09-16 ENCOUNTER — Ambulatory Visit: Payer: BLUE CROSS/BLUE SHIELD | Admitting: Urology

## 2015-09-16 ENCOUNTER — Other Ambulatory Visit: Payer: Self-pay

## 2015-09-16 DIAGNOSIS — E291 Testicular hypofunction: Secondary | ICD-10-CM

## 2015-09-19 ENCOUNTER — Other Ambulatory Visit: Payer: BLUE CROSS/BLUE SHIELD

## 2015-09-19 DIAGNOSIS — E291 Testicular hypofunction: Secondary | ICD-10-CM

## 2015-09-20 LAB — HEPATIC FUNCTION PANEL
ALBUMIN: 4.3 g/dL (ref 3.5–5.5)
ALT: 13 IU/L (ref 0–44)
AST: 16 IU/L (ref 0–40)
Alkaline Phosphatase: 58 IU/L (ref 39–117)
BILIRUBIN TOTAL: 0.4 mg/dL (ref 0.0–1.2)
Bilirubin, Direct: 0.07 mg/dL (ref 0.00–0.40)
Total Protein: 7.6 g/dL (ref 6.0–8.5)

## 2015-09-20 LAB — TESTOSTERONE: Testosterone: 704 ng/dL (ref 348–1197)

## 2015-09-21 ENCOUNTER — Telehealth: Payer: Self-pay

## 2015-09-21 NOTE — Telephone Encounter (Signed)
-----   Message from Nori Riis, PA-C sent at 09/21/2015  8:05 AM EDT ----- Testosterone levels are good.  I suggest a 6 month follow up with HCT, LFT's, morning testosterone before 9 AM and office visit.

## 2015-09-22 NOTE — Telephone Encounter (Signed)
LMOM

## 2015-09-23 NOTE — Telephone Encounter (Signed)
LMOM- will send a letter.  

## 2015-10-05 ENCOUNTER — Other Ambulatory Visit: Payer: Self-pay

## 2015-10-05 DIAGNOSIS — E291 Testicular hypofunction: Secondary | ICD-10-CM

## 2015-10-05 MED ORDER — CLOMIPHENE CITRATE 50 MG PO TABS: ORAL_TABLET | ORAL | Status: DC

## 2015-10-05 NOTE — Progress Notes (Signed)
Original 90 supply was sent to [facility] on [address]. Cancelled that script and resent to mail order pharmacy.

## 2015-10-10 ENCOUNTER — Telehealth: Payer: Self-pay | Admitting: Urology

## 2015-10-10 NOTE — Telephone Encounter (Signed)
Pt called and normally has meds filled a [facility] on [address].  Pt's insurance requires to go through [facility].  [phone]  Ref# [id number]

## 2015-10-14 ENCOUNTER — Telehealth: Payer: Self-pay

## 2015-10-14 MED ORDER — HYDROXYZINE PAMOATE 25 MG PO CAPS: 25.0000 mg | ORAL_CAPSULE | Freq: Four times a day (QID) | ORAL | Status: AC | PRN

## 2015-10-14 NOTE — Telephone Encounter (Signed)
Left detailed message on identified voicemail.

## 2015-10-14 NOTE — Telephone Encounter (Signed)
I can't call him in any pain medicine; that would need to come from his dentist I have sent in some anxiety medicine that should be safe for him to take

## 2015-10-14 NOTE — Telephone Encounter (Signed)
He has a dentist appt on Monday but he is having a lot of dental pain and tylenol and aleve and such are not helping. He wants to know if you can call him in anything for the pain to get him thru to Monday. Also he said his psychotherapist mentioned that maybe we should start him on something such as valium or diazepam for his anxiety regarding his anxiety over his dental issues and appointment.

## 2015-10-15 ENCOUNTER — Encounter: Payer: Self-pay | Admitting: Family Medicine

## 2016-03-16 ENCOUNTER — Other Ambulatory Visit: Payer: BLUE CROSS/BLUE SHIELD

## 2016-03-16 DIAGNOSIS — E291 Testicular hypofunction: Secondary | ICD-10-CM

## 2016-03-17 LAB — TESTOSTERONE: Testosterone: 555 ng/dL (ref 348–1197)

## 2016-03-17 LAB — HEPATIC FUNCTION PANEL
ALBUMIN: 4 g/dL (ref 3.5–5.5)
ALT: 20 IU/L (ref 0–44)
AST: 18 IU/L (ref 0–40)
Alkaline Phosphatase: 59 IU/L (ref 39–117)
BILIRUBIN TOTAL: 0.3 mg/dL (ref 0.0–1.2)
BILIRUBIN, DIRECT: 0.09 mg/dL (ref 0.00–0.40)
TOTAL PROTEIN: 7.5 g/dL (ref 6.0–8.5)

## 2016-03-17 LAB — HEMATOCRIT: Hematocrit: 49.1 % (ref 37.5–51.0)

## 2016-03-20 ENCOUNTER — Ambulatory Visit: Payer: BLUE CROSS/BLUE SHIELD | Admitting: Urology

## 2016-03-20 ENCOUNTER — Encounter: Payer: Self-pay | Admitting: Urology

## 2016-03-22 IMAGING — CT CT ABD-PELV W/ CM
2 of 4 series · 16 of 46 positions shown, 18 images · IV contrast (omnipaque)
Comparison: August 09, 2010

CLINICAL DATA: Acute onset right-sided flank pain

EXAM:
CT ABDOMEN AND PELVIS WITH CONTRAST
TECHNIQUE: Multidetector CT imaging of the abdomen and pelvis was performed
using the standard protocol following bolus administration of
intravenous contrast. Oral contrast was also administered.
CONTRAST:  100mL OMNIPAQUE IOHEXOL 300 MG/ML  SOLN

[Series 2: routine abd pel with · axial · 0.70mm/px · z∈[-552,-86]mm · 13 of 103 slices shown, 15 images]
[im 5/103  soft-tissue]
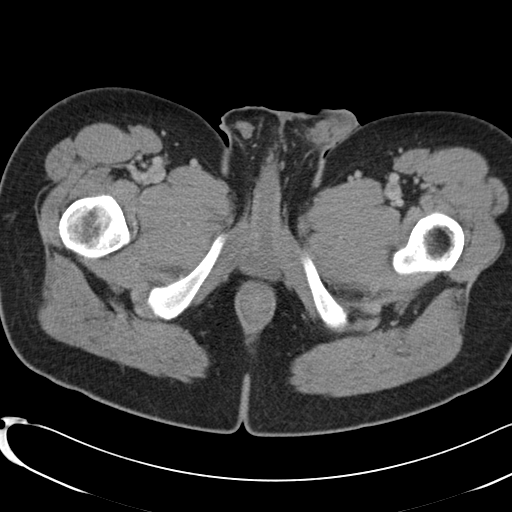
[im 5/103  bone]
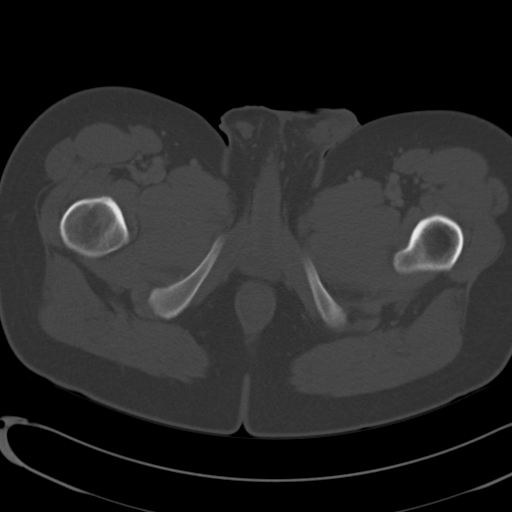
[im 14/103  soft-tissue]
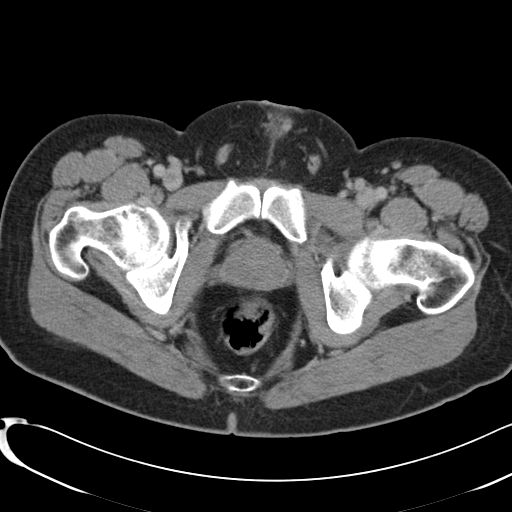
[im 23/103  soft-tissue]
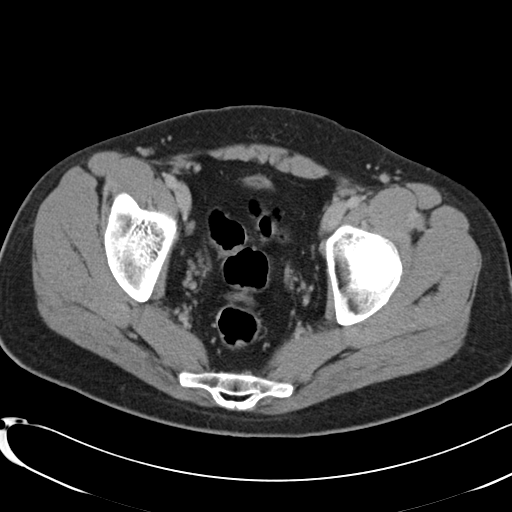
[im 27/103  soft-tissue]
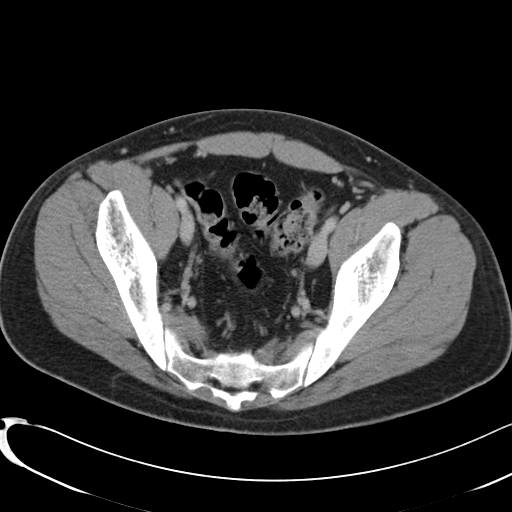
[im 36/103  soft-tissue]
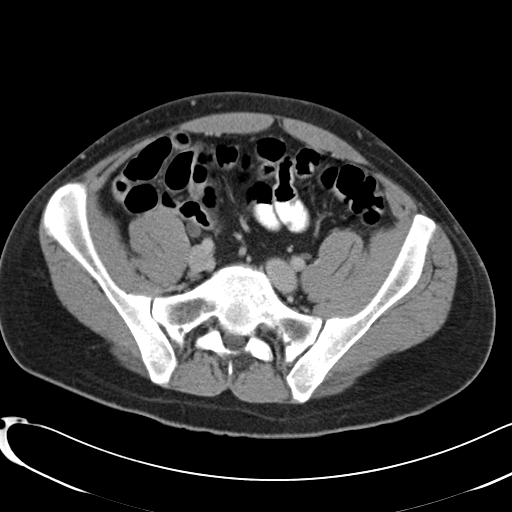
[im 45/103  soft-tissue]
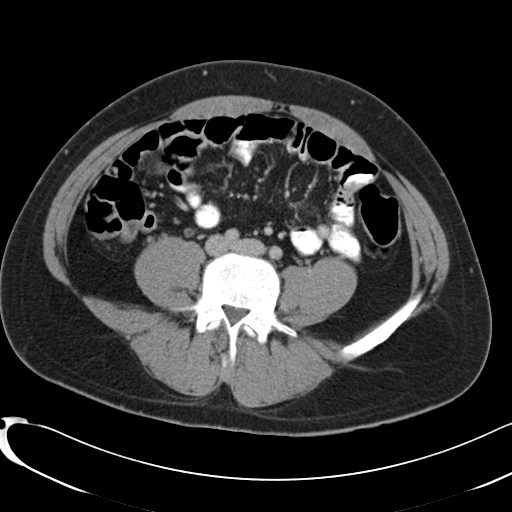
[im 54/103  soft-tissue]
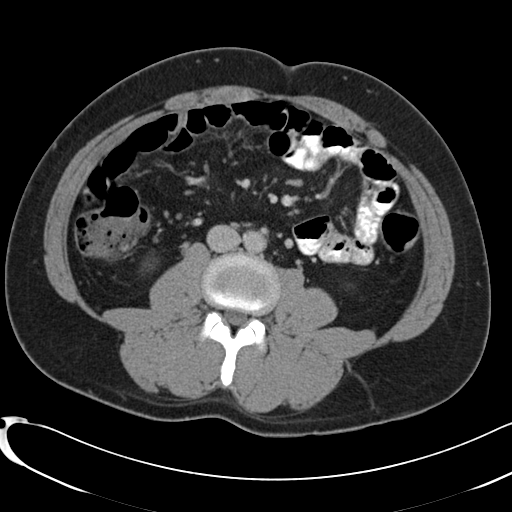
[im 58/103  soft-tissue]
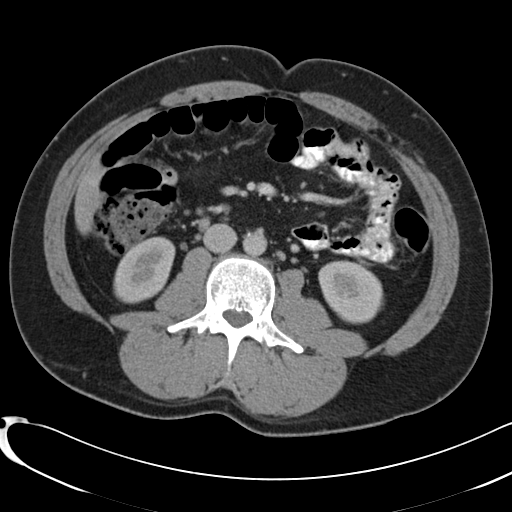
[im 67/103  soft-tissue]
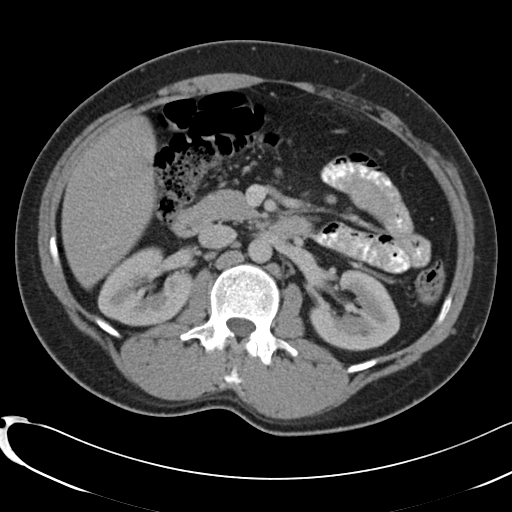
[im 67/103  bone]
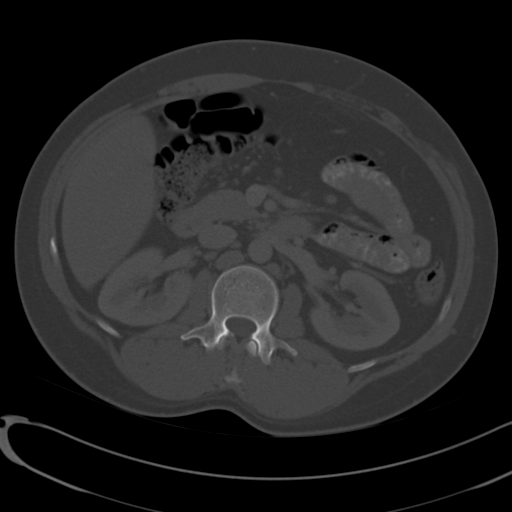
[im 76/103  soft-tissue]
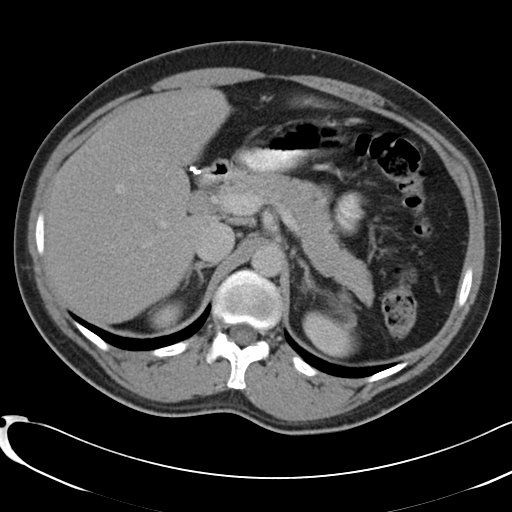
[im 80/103  soft-tissue]
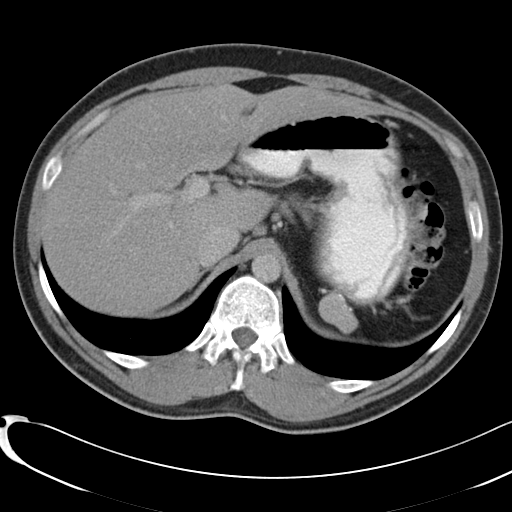
[im 89/103  soft-tissue]
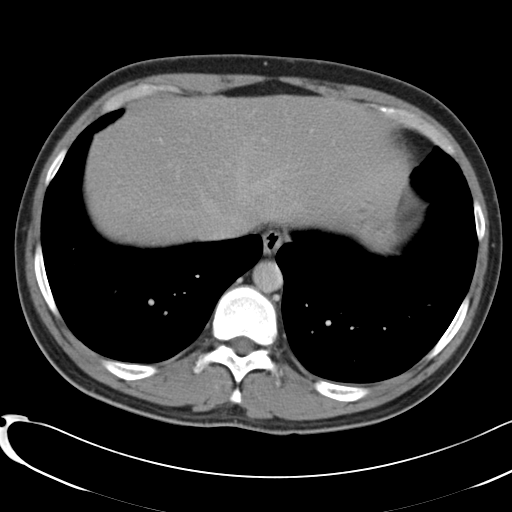
[im 98/103  soft-tissue]
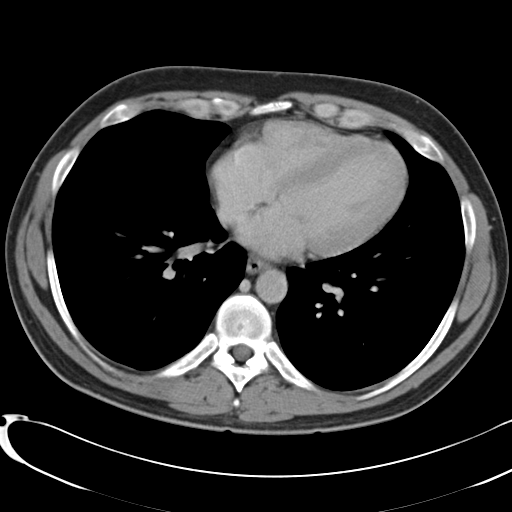

[Series 5: cor routine abd pel with · coronal · 0.67mm/px · 3 of 135 slices shown]
[im 45/135  soft-tissue]
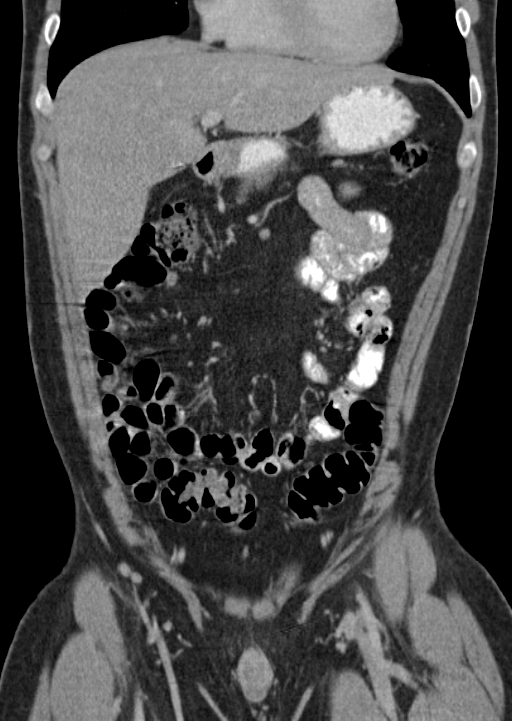
[im 60/135  soft-tissue]
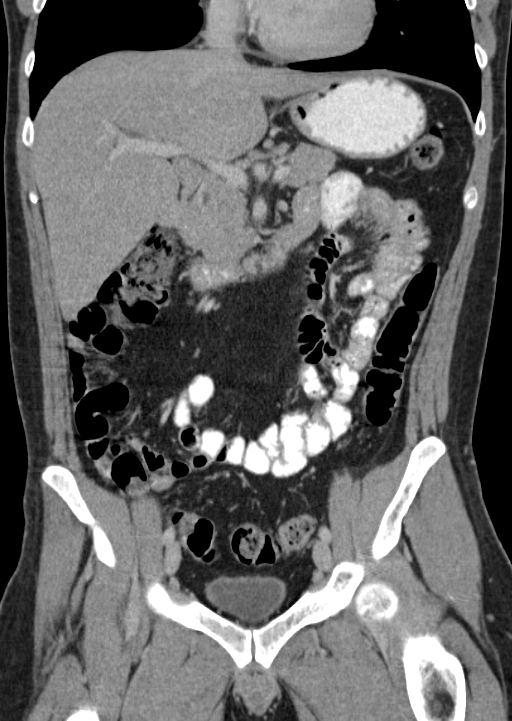
[im 75/135  soft-tissue]
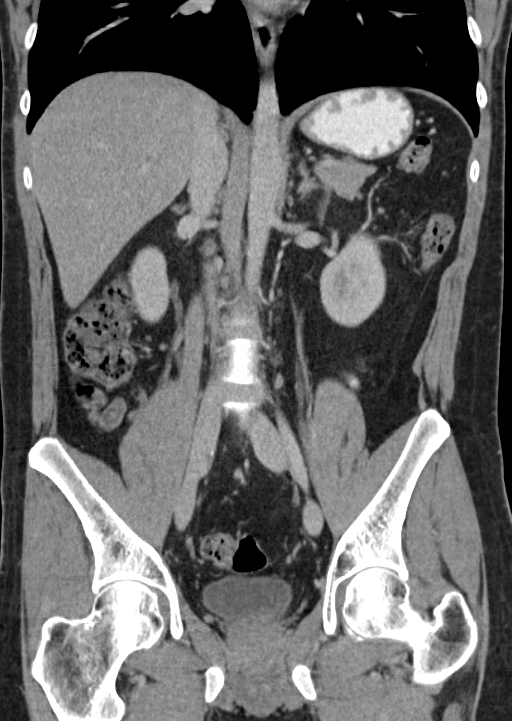

[16 of 46 positions shown; findings below may reference images not displayed]

FINDINGS: Lung bases are clear.

No focal liver lesions are identified. Gallbladder is surgically
absent. There is no appreciable biliary duct dilatation.

The spleen is surgically absent. Pancreas and adrenals appear
normal.

Kidneys bilaterally show no mass or hydronephrosis on either side.
There is no perinephric stranding on either side. There is no renal
calculus on either side. On axial slice 83 series 2, there is a 1 mm
calculus in the distal right ureter with mild surrounding ureteral
edema.

In the pelvis, the urinary bladder is midline with normal wall
thickness. There is no pelvic mass or pelvic fluid collection. The
appendix appears normal.

There is no bowel obstruction. No free air or portal venous air.
There is no appreciable ascites, adenopathy, or abscess in the
abdomen or pelvis. There is mild atherosclerotic change in the
distal aorta and proximal common iliac arteries. There is no
abdominal aortic aneurysm. There are no blastic or lytic bone
lesions.
IMPRESSION: 1 mm calculus distal right ureter with slight surrounding ureteral
edema. No appreciable hydronephrosis on the right. No other calculi
are seen in either kidney or ureter.

Appendix appears normal.  No bowel obstruction.  No abscess.

Spleen and gallbladder absent.

## 2016-05-24 ENCOUNTER — Encounter: Payer: Self-pay | Admitting: Urology

## 2016-05-24 ENCOUNTER — Ambulatory Visit (INDEPENDENT_AMBULATORY_CARE_PROVIDER_SITE_OTHER): Payer: BLUE CROSS/BLUE SHIELD | Admitting: Urology

## 2016-05-24 VITALS — BP 135/82 | HR 86 | Ht 72.0 in | Wt 230.0 lb

## 2016-05-24 DIAGNOSIS — N138 Other obstructive and reflux uropathy: Secondary | ICD-10-CM

## 2016-05-24 DIAGNOSIS — E291 Testicular hypofunction: Secondary | ICD-10-CM | POA: Diagnosis not present

## 2016-05-24 DIAGNOSIS — N401 Enlarged prostate with lower urinary tract symptoms: Secondary | ICD-10-CM

## 2016-05-24 MED ORDER — CLOMIPHENE CITRATE 50 MG PO TABS: ORAL_TABLET | ORAL | Status: AC

## 2016-05-24 NOTE — Progress Notes (Signed)
05/24/2016 8:51 AM   Riley Obrien 1986-03-15 [id number]  Referring provider: Arnetha Courser, MD [address] [place], [place] [address]  Chief Complaint  Patient presents with  . Hypogonadism    follow up    HPI: Patient is a 30 year old Caucasian male with hypogonadism who presents today for a 6 month follow-up.  Hypogonadism Patient is not experiencing a decrease in libido, a lack of energy, a decrease in strength, a loss in height, a decreased enjoyment in life, sadness and/or grumpiness, erections being less strong, a recent deterioration in an ability to play sports, falling asleep after dinner and a recent deterioration in their work performance.  This is indicated by his responses to the ADAM questionnaire. He is not experiencing spontaneous erections. He does not have sleep apnea.  He is currently managing his hypogonadism with Clomid 50 mg, 1/2 tablet daily.   His most recent serum testosterone level was 555 ng/dL on 03/16/2016. This was a morning draw.      Androgen Deficiency in the Aging Male      05/24/16 0800       Androgen Deficiency in the Aging Male   Do you have a decrease in libido (sex drive) No     Do you have lack of energy No     Do you have a decrease in strength and/or endurance No     Have you lost height No     Have you noticed a decreased "enjoyment of life" No     Are you sad and/or grumpy No     Are your erections less strong No     Have you noticed a recent deterioration in your ability to play sports No     Are you falling asleep after dinner No     Has there been a recent deterioration in your work performance No        Erectile dysfunction His SHIM score is 25, which is no ED.  His risk factors for ED are working night shift, depression, anxiety, smoking and hypogonadism.  He denies any painful erections or curvatures with his erections.        SHIM      05/24/16 0843       SHIM: Over the last 6 months:   How do  you rate your confidence that you could get and keep an erection? Very High     When you had erections with sexual stimulation, how often were your erections hard enough for penetration (entering your partner)? Almost Always or Always     During sexual intercourse, how often were you able to maintain your erection after you had penetrated (entered) your partner? Not Difficult     During sexual intercourse, how difficult was it to maintain your erection to completion of intercourse? Not Difficult     When you attempted sexual intercourse, how often was it satisfactory for you? Not Difficult     SHIM Total Score   SHIM 25        Score: 1-7 Severe ED 8-11 Moderate ED 12-16 Mild-Moderate ED 17-21 Mild ED 22-25 No ED  BPH WITH LUTS His IPSS score today is 1, which is mild lower urinary tract symptomatology. He is delighted with his quality life due to his urinary symptoms.  He denies any dysuria, hematuria or suprapubic pain.  He also denies any recent fevers, chills, nausea or vomiting. He does not have a family history of PCa.  IPSS      05/24/16 0800       International Prostate Symptom Score   How often have you had the sensation of not emptying your bladder? Not at All     How often have you had to urinate less than every two hours? Less than 1 in 5 times     How often have you found you stopped and started again several times when you urinated? Not at All     How often have you found it difficult to postpone urination? Not at All     How often have you had a weak urinary stream? Not at All     How often have you had to strain to start urination? Not at All     How many times did you typically get up at night to urinate? None     Total IPSS Score 1     Quality of Life due to urinary symptoms   If you were to spend the rest of your life with your urinary condition just the way it is now how would you feel about that? Delighted        Score:  1-7 Mild 8-19 Moderate 20-35  Severe  PMH: Past Medical History  Diagnosis Date  . Asthma   . Anxiety   . Depression   . Heartburn   . History of kidney stones     recently first one seen in ER last of July 2016  . Hypogonadism male   . B12 deficiency 05/30/2015  . Dental caries 06/09/2015  . 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 Polyuria 05/26/2015  . Ureteral stone 06/28/2015  . Thrombocytosis ([place]) 05/30/2015  . 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 Need for vaccination with combined diphtheria-tetanus-pertussis (DTaP) 05/26/2015    Given today; tetanus part good for 10 years, whooping cough good for life     Surgical History: Past Surgical History  Procedure Laterality Date  . Splenectomy, total    . Cholecystectomy      Home Medications:    Medication List       This list is accurate as of: 05/24/16  8:51 AM.  Always use your most recent med list.               busPIRone 10 MG tablet  Commonly known as:  BUSPAR  Take 10 mg by mouth 4 (four) times daily.     clomiPHENE 50 MG tablet  Commonly known as:  CLOMID  Take one half a tablet daily     divalproex 500 MG 24 hr tablet  Commonly known as:  DEPAKOTE ER  Take 500 mg by mouth daily.     hydrOXYzine 25 MG capsule  Commonly known as:  VISTARIL  Take 1 capsule (25 mg total) by mouth every 6 (six) hours as needed for anxiety.     penicillin v potassium 500 MG tablet  Commonly known as:  VEETID  Take 1 tablet (500 mg total) by mouth 3 (three) times daily.     QUEtiapine 200 MG tablet  Commonly known as:  SEROQUEL  Take 400 mg by mouth daily.     vitamin B-12 1000 MCG tablet  Commonly known as:  CYANOCOBALAMIN  Take 1,000 mcg by mouth daily.     Vitamin D3 5000 units Tabs  Take 5,000 Units by mouth once a week.        Allergies: No Known Allergies  Family History: Family History  Problem Relation Age of  Onset  . Diabetes Maternal Grandmother   . Alzheimer's disease Paternal Grandmother   . Kidney disease Neg Hx   . Prostate cancer  Neg Hx   . Cancer Neg Hx   . Heart disease Neg Hx   . Hypertension Neg Hx   . Stroke Neg Hx   . COPD Neg Hx     Social History:  reports that he has been smoking Cigarettes.  He has a 14 pack-year smoking history. He has never used smokeless tobacco. He reports that he does not drink alcohol or use illicit drugs.  ROS: UROLOGY Frequent Urination?: No Hard to postpone urination?: No Burning/pain with urination?: No Get up at night to urinate?: No Leakage of urine?: No Urine stream starts and stops?: No Trouble starting stream?: No Do you have to strain to urinate?: No Blood in urine?: No Urinary tract infection?: No Sexually transmitted disease?: No Injury to kidneys or bladder?: No Painful intercourse?: No Weak stream?: No Erection problems?: No Penile pain?: No  Gastrointestinal Nausea?: No Vomiting?: No Indigestion/heartburn?: No Diarrhea?: No Constipation?: No  Constitutional Fever: No Night sweats?: No Weight loss?: No Fatigue?: No  Skin Skin rash/lesions?: No Itching?: No  Eyes Blurred vision?: No Double vision?: No  Ears/Nose/Throat Sore throat?: No Sinus problems?: No  Hematologic/Lymphatic Swollen glands?: No Easy bruising?: No  Cardiovascular Leg swelling?: No Chest pain?: No  Respiratory Cough?: No Shortness of breath?: No  Endocrine Excessive thirst?: No  Musculoskeletal Back pain?: No Joint pain?: No  Neurological Headaches?: No Dizziness?: No  Psychologic Depression?: No Anxiety?: No  Physical Exam: BP 135/82 mmHg  Pulse 86  Ht 6' (1.829 m)  Wt 230 lb (104.327 kg)  BMI 31.19 kg/m2  Constitutional: Well nourished. Alert and oriented, No acute distress. HEENT: [place] AT, moist mucus membranes. Trachea midline, no masses. Cardiovascular: No clubbing, cyanosis, or edema. Respiratory: Normal respiratory effort, no increased work of breathing. GI: Abdomen is soft, non tender, non distended, no abdominal masses. Liver and  spleen not palpable.  No hernias appreciated.  Stool sample for occult testing is not indicated.   GU: No CVA tenderness.  No bladder fullness or masses.  Patient with circumcised phallus.  Urethral meatus is patent.  No penile discharge. No penile lesions or rashes. Scrotum without lesions, cysts, rashes and/or edema.  Testicles are located scrotally bilaterally. No masses are appreciated in the testicles. Left and right epididymis are normal. Rectal: Patient with  normal sphincter tone. Anus and perineum without scarring or rashes. No rectal masses are appreciated. Prostate is approximately 50 grams, no nodules are appreciated. Seminal vesicles are normal. Skin: No rashes, bruises or suspicious lesions. Lymph: No cervical or inguinal adenopathy. Neurologic: Grossly intact, no focal deficits, moving all 4 extremities. Psychiatric: Normal mood and affect.  Laboratory Data: Lab Results  Component Value Date   WBC 23.1* 09/06/2015   HGB 16.1 06/20/2015   HCT 49.1 03/16/2016   MCV 85 09/06/2015   PLT 375 09/06/2015    Lab Results  Component Value Date   CREATININE 0.90 06/15/2015     Lab Results  Component Value Date   TESTOSTERONE 555 03/16/2016     Lab Results  Component Value Date   TSH 0.861 05/26/2015       Component Value Date/Time   CHOL 180 05/26/2015 0944   HDL 31* 05/26/2015 0944   LDLCALC 126* 05/26/2015 0944    Lab Results  Component Value Date   AST 18 03/16/2016   Lab Results  Component  Value Date   ALT 20 03/16/2016      Assessment & Plan:    1. Hypogonadism:     -most recent testosterone level is 555 ng/dL on 03/16/2016  -continue Clomid 50 mg, 1/2 tablet daily  -RTC in 6 months for HCT, testosterone, LFT's, ADAM and exam  2. BPH with LUTS  - IPSS score is 1/0    - RTC in 6 months for IPSS and exam, as testosterone therapy can cause prostate enlargement and worsen LUTS  3. Erectile dysfunction:     -SHIM score is 25  -RTC in 6 months for  SHIM score and exam, as testosterone therapy can affect erections   Return in about 6 months (around 11/23/2016) for IPSS, SHIM, ADAM, LFT's, HCT, testosterone and exam.  These notes generated with voice recognition software. I apologize for typographical errors.  Zara Council, [facility] [address] [place], [place] [address] [phone]

## 2016-05-26 DIAGNOSIS — N138 Other obstructive and reflux uropathy: Secondary | ICD-10-CM | POA: Insufficient documentation

## 2016-05-26 DIAGNOSIS — N401 Enlarged prostate with lower urinary tract symptoms: Secondary | ICD-10-CM

## 2016-10-04 ENCOUNTER — Encounter: Payer: Self-pay | Admitting: Family Medicine

## 2016-10-20 IMAGING — US US RENAL
1 series · 14 of 25 positions shown · non-contrast
Comparison: CT 06/15/2015.

CLINICAL DATA: Kidney stones.

EXAM:
RENAL / URINARY TRACT ULTRASOUND COMPLETE

[Series 1: us renal · 0.25mm/px · 14 of 30 slices shown]
[im 1/30]
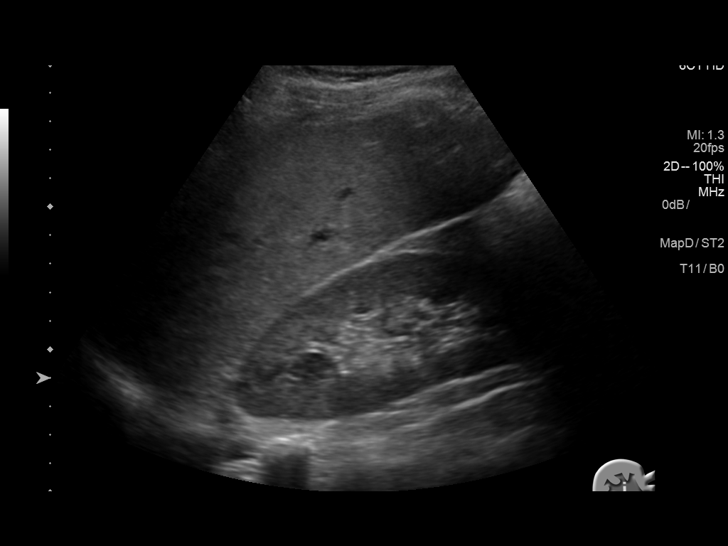
[im 3/30]
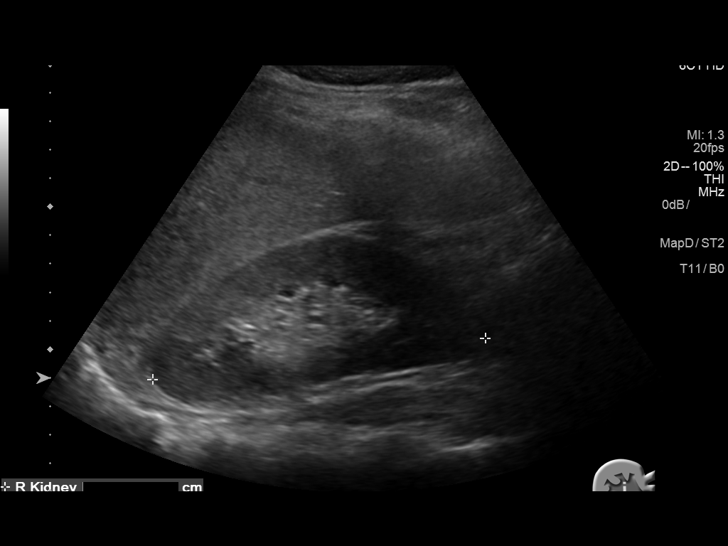
[im 5/30]
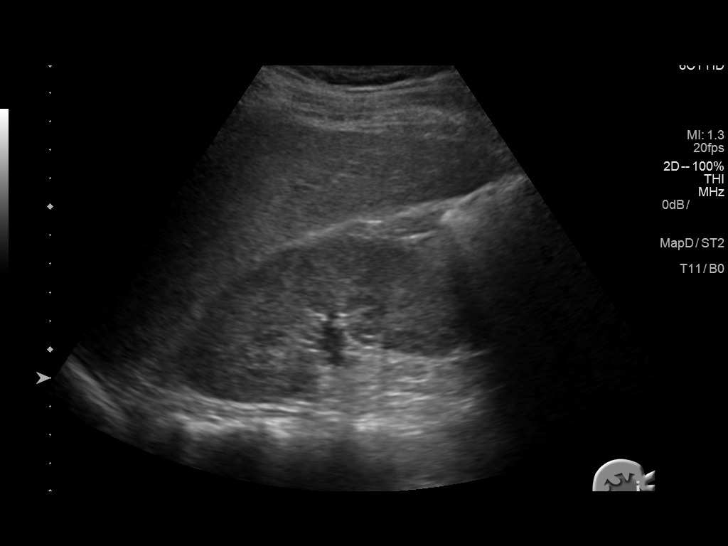
[im 8/30]
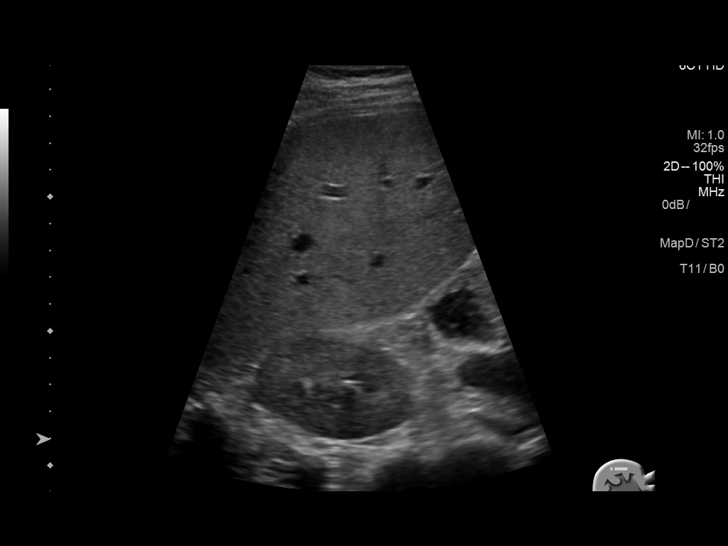
[im 10/30]
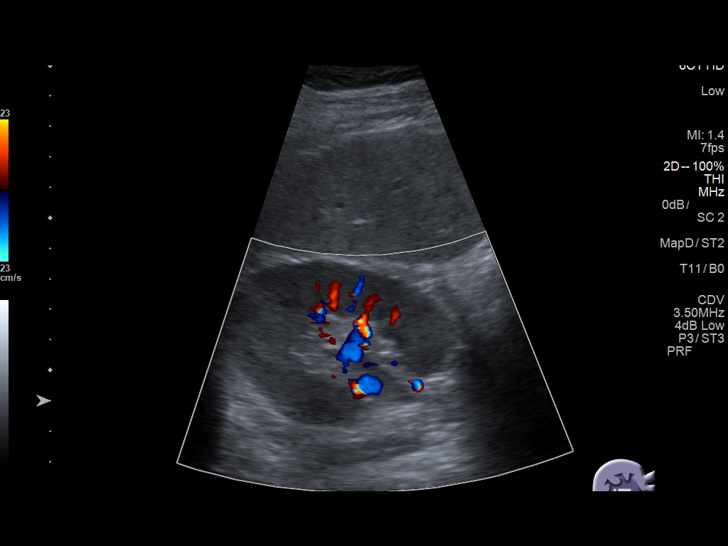
[im 11/30]
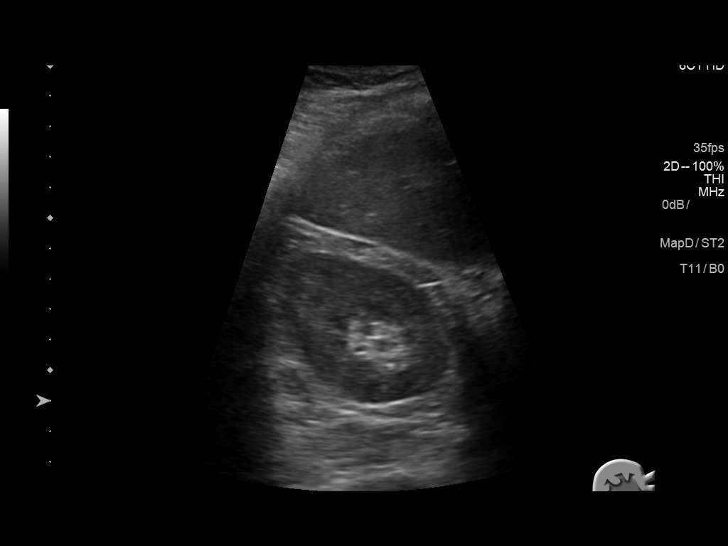
[im 14/30]
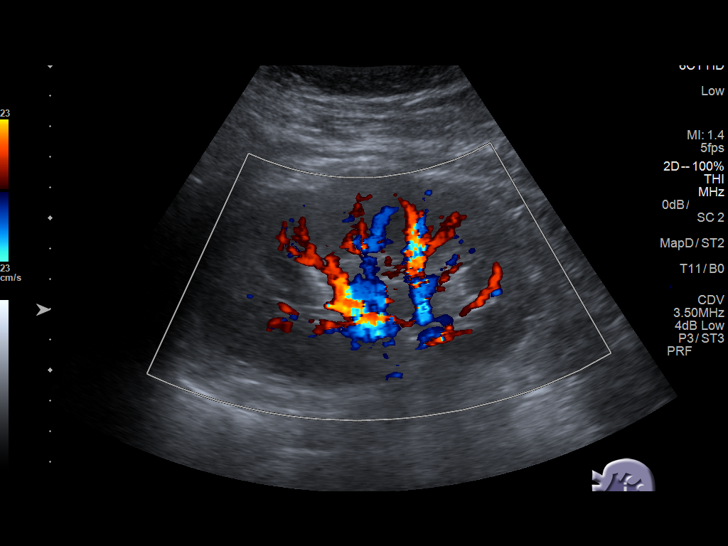
[im 16/30]
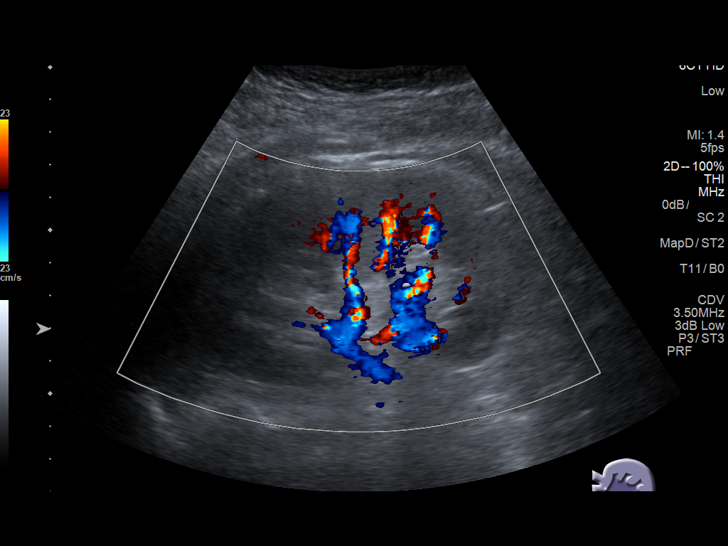
[im 19/30]
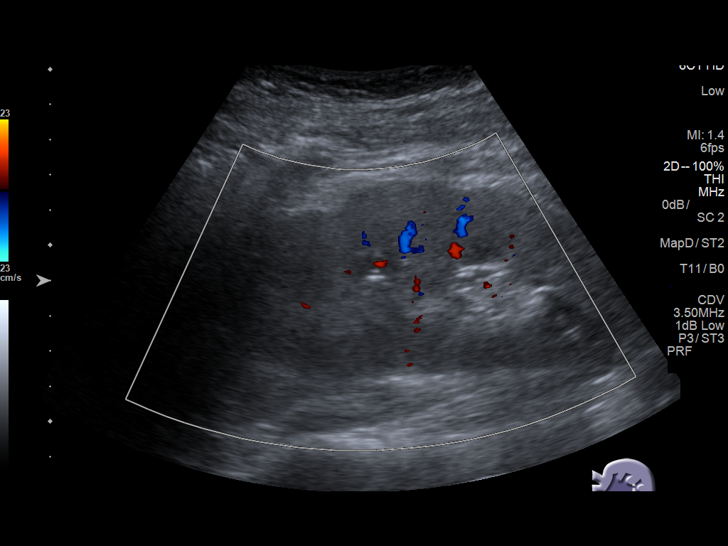
[im 20/30]
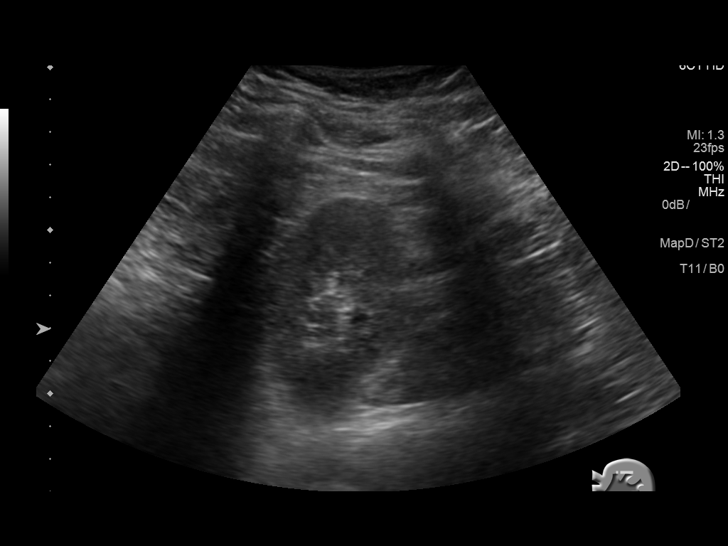
[im 22/30]
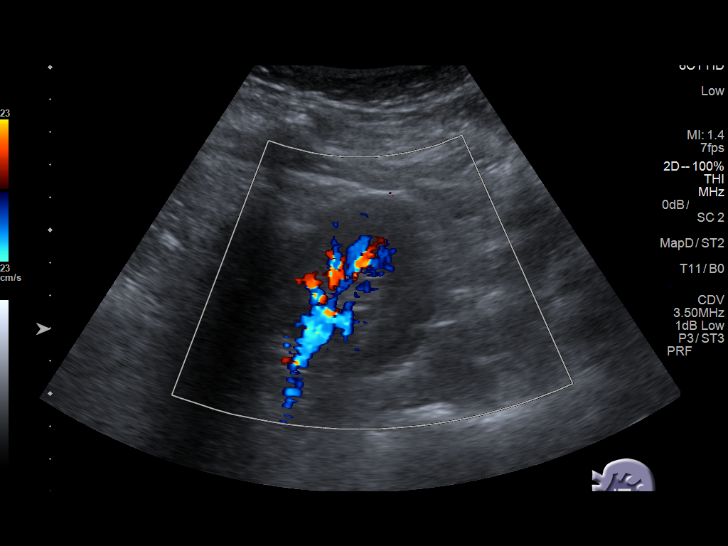
[im 25/30]
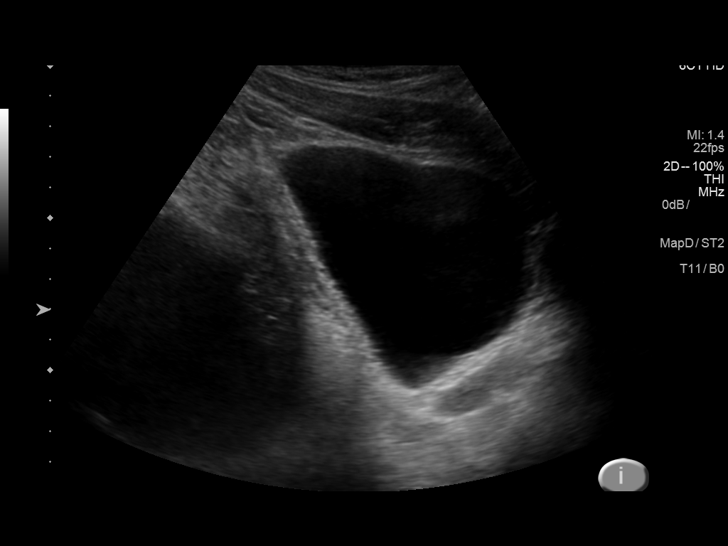
[im 27/30]
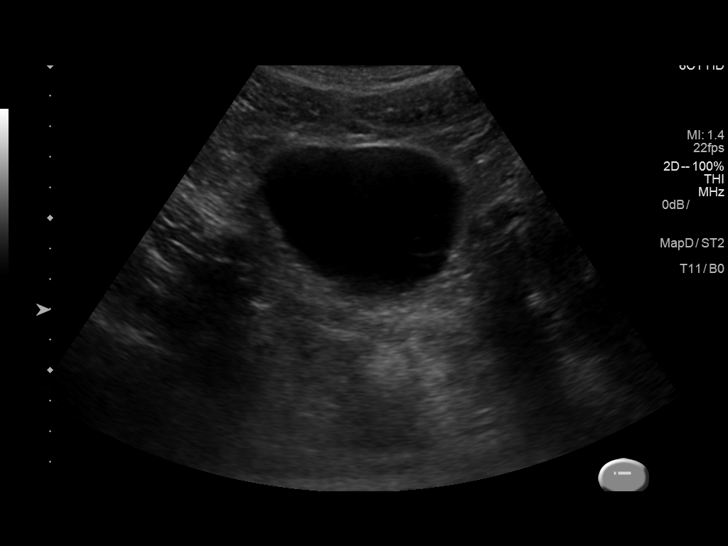
[im 30/30]
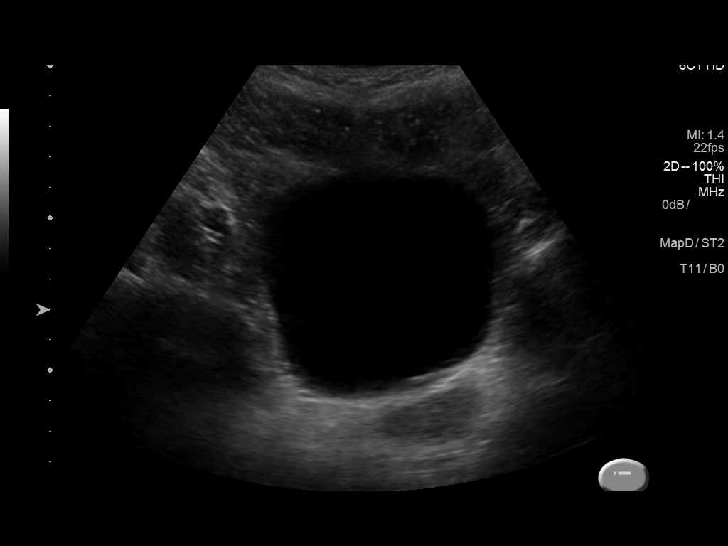

[14 of 25 positions shown; findings below may reference images not displayed]

FINDINGS: Right Kidney:

Length: 11.8 cm. Echogenicity within normal limits. No mass or
hydronephrosis visualized.

Left Kidney:

Length: 12.4 cm. Echogenicity within normal limits. No mass or
hydronephrosis visualized.

Bladder:

Appears normal for degree of bladder distention.
IMPRESSION: Normal exam.

## 2017-02-26 ENCOUNTER — Other Ambulatory Visit: Payer: Self-pay | Admitting: Urology

## 2017-02-26 DIAGNOSIS — E291 Testicular hypofunction: Secondary | ICD-10-CM

## 2017-02-26 NOTE — Telephone Encounter (Signed)
Pt pharmacy sent a refill request of clomid. Refill DENIED. Pt need an appt and labs prior.

## 2017-03-06 ENCOUNTER — Encounter: Payer: Self-pay | Admitting: Urology

## 2017-03-07 ENCOUNTER — Other Ambulatory Visit: Payer: Self-pay

## 2017-03-07 DIAGNOSIS — E291 Testicular hypofunction: Secondary | ICD-10-CM

## 2017-03-08 ENCOUNTER — Other Ambulatory Visit: Payer: BLUE CROSS/BLUE SHIELD

## 2017-03-08 DIAGNOSIS — E291 Testicular hypofunction: Secondary | ICD-10-CM

## 2017-03-09 LAB — HEPATIC FUNCTION PANEL
ALK PHOS: 81 IU/L (ref 39–117)
ALT: 46 IU/L — ABNORMAL HIGH (ref 0–44)
AST: 41 IU/L — ABNORMAL HIGH (ref 0–40)
Albumin: 4.2 g/dL (ref 3.5–5.5)
Bilirubin Total: 0.3 mg/dL (ref 0.0–1.2)
Bilirubin, Direct: 0.11 mg/dL (ref 0.00–0.40)
TOTAL PROTEIN: 7.9 g/dL (ref 6.0–8.5)

## 2017-03-09 LAB — TESTOSTERONE: Testosterone: 405 ng/dL (ref 264–916)

## 2017-03-09 LAB — HEMATOCRIT: HEMATOCRIT: 44.4 % (ref 37.5–51.0)

## 2017-03-16 NOTE — Progress Notes (Deleted)
03/18/2017 9:37 PM   Riley Obrien 1985-11-30 [id number]  Referring provider: Arnetha Courser, MD [address] [place], [place] [address]  No chief complaint on file.   HPI: Patient is a 31 year old Caucasian male with hypogonadism who presents today for a 6 month follow-up.  Hypogonadism Patient is not experiencing a decrease in libido, a lack of energy, a decrease in strength, a loss in height, a decreased enjoyment in life, sadness and/or grumpiness, erections being less strong, a recent deterioration in an ability to play sports, falling asleep after dinner and a recent deterioration in their work performance.  This is indicated by his responses to the ADAM questionnaire. He is not experiencing spontaneous erections. He does not have sleep apnea.  He is currently managing his hypogonadism with Clomid 50 mg, 1/2 tablet daily.   His most recent serum testosterone level was 405 ng/dL on 03/08/2017.  HCT was wnl.  LFT's are elevated.      Erectile dysfunction His SHIM score is ***, which is *** ED.  His previous SHIM was 25.  His risk factors for ED are working night shift, depression, anxiety, smoking and hypogonadism.  He denies any painful erections or curvatures with his erections.      Score: 1-7 Severe ED 8-11 Moderate ED 12-16 Mild-Moderate ED 17-21 Mild ED 22-25 No ED  BPH WITH LUTS His IPSS score today is ***, which is *** lower urinary tract symptomatology. He is delighted with his quality life due to his urinary symptoms.  His previous I PSS score was 1/0.  He denies any dysuria, hematuria or suprapubic pain.  He also denies any recent fevers, chills, nausea or vomiting. He does not have a family history of PCa.    Score:  1-7 Mild 8-19 Moderate 20-35 Severe  PMH: Past Medical History:  Diagnosis Date  . Anxiety   . Asthma   . B12 deficiency 05/30/2015  . Dental caries 06/09/2015  . Depression   . Heartburn   . History of kidney stones    recently first one seen in ER last of July 2016  . 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 Hypogonadism male   . Need for vaccination with combined diphtheria-tetanus-pertussis (DTaP) 05/26/2015   Given today; tetanus part good for 10 years, whooping cough good for life   . Polyuria 05/26/2015  . Thrombocytosis ([place]) 05/30/2015  . Ureteral stone 06/28/2015    Surgical History: Past Surgical History:  Procedure Laterality Date  . CHOLECYSTECTOMY    . SPLENECTOMY, TOTAL      Home Medications:  Allergies as of 03/18/2017   No Known Allergies     Medication List       Accurate as of 03/16/17  9:37 PM. Always use your most recent med list.          busPIRone 10 MG tablet Commonly known as:  BUSPAR Take 10 mg by mouth 4 (four) times daily.   clomiPHENE 50 MG tablet Commonly known as:  CLOMID Take one half a tablet daily   divalproex 500 MG 24 hr tablet Commonly known as:  DEPAKOTE ER Take 500 mg by mouth daily.   hydrOXYzine 25 MG capsule Commonly known as:  VISTARIL Take 1 capsule (25 mg total) by mouth every 6 (six) hours as needed for anxiety.   penicillin v potassium 500 MG tablet Commonly known as:  VEETID Take 1 tablet (500 mg total) by mouth 3 (three) times  daily.   QUEtiapine 200 MG tablet Commonly known as:  SEROQUEL Take 400 mg by mouth daily.   vitamin B-12 1000 MCG tablet Commonly known as:  CYANOCOBALAMIN Take 1,000 mcg by mouth daily.   Vitamin D3 5000 units Tabs Take 5,000 Units by mouth once a week.       Allergies: No Known Allergies  Family History: Family History  Problem Relation Age of Onset  . Diabetes Maternal Grandmother   . Alzheimer's disease Paternal Grandmother   . Kidney disease Neg Hx   . Prostate cancer Neg Hx   . Cancer Neg Hx   . Heart disease Neg Hx   . Hypertension Neg Hx   . Stroke Neg Hx   . COPD Neg Hx     Social History:  reports that he has been smoking Cigarettes.  He has a 14.00  pack-year smoking history. He has never used smokeless tobacco. He reports that he does not drink alcohol or use drugs.  ROS:                                        Physical Exam: There were no vitals taken for this visit.  Constitutional: Well nourished. Alert and oriented, No acute distress. HEENT: [place] AT, moist mucus membranes. Trachea midline, no masses. Cardiovascular: No clubbing, cyanosis, or edema. Respiratory: Normal respiratory effort, no increased work of breathing. GI: Abdomen is soft, non tender, non distended, no abdominal masses. Liver and spleen not palpable.  No hernias appreciated.  Stool sample for occult testing is not indicated.   GU: No CVA tenderness.  No bladder fullness or masses.  Patient with circumcised phallus.  Urethral meatus is patent.  No penile discharge. No penile lesions or rashes. Scrotum without lesions, cysts, rashes and/or edema.  Testicles are located scrotally bilaterally. No masses are appreciated in the testicles. Left and right epididymis are normal. Rectal: Patient with  normal sphincter tone. Anus and perineum without scarring or rashes. No rectal masses are appreciated. Prostate is approximately 50 grams, no nodules are appreciated. Seminal vesicles are normal. Skin: No rashes, bruises or suspicious lesions. Lymph: No cervical or inguinal adenopathy. Neurologic: Grossly intact, no focal deficits, moving all 4 extremities. Psychiatric: Normal mood and affect.  Laboratory Data: Lab Results  Component Value Date   WBC 23.1 (HH) 09/06/2015   HGB 16.1 06/20/2015   HCT 44.4 03/08/2017   MCV 85 09/06/2015   PLT 375 09/06/2015    Lab Results  Component Value Date   CREATININE 0.90 06/15/2015     Lab Results  Component Value Date   TESTOSTERONE 405 03/08/2017     Lab Results  Component Value Date   TSH 0.861 05/26/2015       Component Value Date/Time   CHOL 180 05/26/2015 0944   HDL 31 (L) 05/26/2015 0944    LDLCALC 126 (H) 05/26/2015 0944    Lab Results  Component Value Date   AST 41 (H) 03/08/2017   Lab Results  Component Value Date   ALT 46 (H) 03/08/2017      Assessment & Plan:    1. Hypogonadism:     -most recent testosterone level is 405 ng/dL on 03/08/2017  -continue Clomid 50 mg, 1/2 tablet daily ***  -RTC in 6 months for HCT, testosterone, LFT's, ADAM and exam  2. BPH with LUTS  - IPSS score is 1/0    -  RTC in 6 months for IPSS and exam, as testosterone therapy can cause prostate enlargement and worsen LUTS  3. Erectile dysfunction:     -SHIM score is 25  -RTC in 6 months for SHIM score and exam, as testosterone therapy can affect erections   No Follow-up on file.  These notes generated with voice recognition software. I apologize for typographical errors.  Zara Council, [facility] [address] [place], [place] [address] [phone]

## 2017-03-18 ENCOUNTER — Encounter: Payer: Self-pay | Admitting: Urology

## 2017-03-18 ENCOUNTER — Ambulatory Visit: Payer: BLUE CROSS/BLUE SHIELD | Admitting: Urology

## 2017-09-14 ENCOUNTER — Emergency Department
Admission: EM | Admit: 2017-09-14 | Discharge: 2017-09-14 | Disposition: A | Discharge status: Home / Self Care | Payer: BLUE CROSS/BLUE SHIELD | Attending: Emergency Medicine | Admitting: Emergency Medicine

## 2017-09-14 ENCOUNTER — Emergency Department: Payer: BLUE CROSS/BLUE SHIELD

## 2017-09-14 DIAGNOSIS — R079 Chest pain, unspecified: Secondary | ICD-10-CM

## 2017-09-14 DIAGNOSIS — Z79899 Other long term (current) drug therapy: Secondary | ICD-10-CM | POA: Insufficient documentation

## 2017-09-14 DIAGNOSIS — R0789 Other chest pain: Secondary | ICD-10-CM

## 2017-09-14 DIAGNOSIS — F17210 Nicotine dependence, cigarettes, uncomplicated: Secondary | ICD-10-CM | POA: Diagnosis not present

## 2017-09-14 DIAGNOSIS — J45909 Unspecified asthma, uncomplicated: Secondary | ICD-10-CM | POA: Insufficient documentation

## 2017-09-14 LAB — TROPONIN I: Troponin I: <0.03 ng/mL (ref <0.03)

## 2017-09-14 LAB — BASIC METABOLIC PANEL
Anion gap: 10 (ref 5–15)
BUN: 8 mg/dL (ref 6–20)
CHLORIDE: 100 mmol/L — AB (ref 101–111)
CO2: 23 mmol/L (ref 22–32)
CREATININE: 0.72 mg/dL (ref 0.61–1.24)
Calcium: 9.2 mg/dL (ref 8.9–10.3)
GFR calc Af Amer: >60 mL/min (ref >60)
GFR calc non Af Amer: >60 mL/min (ref >60)
GLUCOSE: 111 mg/dL — AB (ref 65–99)
POTASSIUM: 3.2 mmol/L — AB (ref 3.5–5.1)
SODIUM: 133 mmol/L — AB (ref 135–145)

## 2017-09-14 LAB — CBC
HEMATOCRIT: 39.8 % — AB (ref 40.0–52.0)
Hemoglobin: 14.6 g/dL (ref 13.0–18.0)
MCH: 30.2 pg (ref 26.0–34.0)
MCHC: 36.6 g/dL — AB (ref 32.0–36.0)
MCV: 82.4 fL (ref 80.0–100.0)
PLATELETS: 449 10*3/uL — AB (ref 150–440)
RBC: 4.83 MIL/uL (ref 4.40–5.90)
RDW: 13.7 % (ref 11.5–14.5)
WBC: 22.4 10*3/uL — AB (ref 3.8–10.6)

## 2017-09-14 MED ORDER — ALUM & MAG HYDROXIDE-SIMETH 200-200-20 MG/5ML PO SUSP
15.0000 mL | Freq: Once | ORAL | Status: AC
  Administered 2017-09-14: 15 mL via ORAL
  Filled 2017-09-14: qty 30

## 2017-09-14 MED ORDER — IOPAMIDOL (ISOVUE-370) INJECTION 76%
75.0000 mL | Freq: Once | INTRAVENOUS | Status: AC | PRN
  Administered 2017-09-14: 75 mL via INTRAVENOUS

## 2017-09-14 MED ORDER — MORPHINE SULFATE (PF) 4 MG/ML IV SOLN
4.0000 mg | Freq: Once | INTRAVENOUS | Status: AC
  Administered 2017-09-14: 4 mg via INTRAVENOUS
  Filled 2017-09-14: qty 1

## 2017-09-14 MED ORDER — POTASSIUM CHLORIDE CRYS ER 20 MEQ PO TBCR
40.0000 meq | EXTENDED_RELEASE_TABLET | Freq: Once | ORAL | Status: AC
  Administered 2017-09-14: 40 meq via ORAL
  Filled 2017-09-14: qty 2

## 2017-09-14 NOTE — ED Notes (Signed)

## 2017-09-14 NOTE — ED Triage Notes (Signed)
Pt arrives to ED from home via [facility] with c/o centralized CP with radiation into the LEFT side of the neck and down the LEFT arm. Pt denies previous cardiac history; pt denies SHOB, no N/V/D, no diaphoresis. EMS reports giving pt 324 ASA and 1 SL nitro spray en rout with reduction of pain from 10/10 to 3/10. Pt is A&O, in NAD; RR even, regular and unlabored; skin color/temp is WNL.

## 2017-09-14 NOTE — ED Provider Notes (Signed)
[facility] Emergency Department Provider Note   ____________________________________________   First MD Initiated Contact with Patient 09/14/17 1856     (approximate)  I have reviewed the triage vital signs and the nursing notes.   HISTORY  Chief Complaint Chest Pain    HPI Riley Obrien is a 31 y.o. male reports about 2 hours ago while seated on the couch been experiencing a hard to describe feeling in his left chest that radiated slightly into his left upper arm.  Reports the pain started 2 hours ago rather abruptly while at rest on the couch.  No nausea vomiting or abdominal pain.  No trouble breathing.  Denies sweating.  Was given 324 mg of aspirin with EMS as well as 1 nitroglycerin, patient reports the pain came down is about a 2 out of 10 at present.  Reports it is very hard to describe the symptoms.  The pain is not sharp but is not heavy.  It is not described as a tightness.  At the present time there is no radiation.  He does report a history of being a smoker, reports heart disease in the mid and his family occurring in their 26s but no sooner onset.  No history of drug abuse except for marijuana which she reports the last use several years ago.  Patient does report he had similar symptoms last night which went away on their own when he was going to bed.  He also reports is a history of acid reflux is felt somewhat similar in the past.  Past Medical History:  Diagnosis Date  . Anxiety   . Asthma   . B12 deficiency 05/30/2015  . Dental caries 06/09/2015  . Depression   . Heartburn   . History of kidney stones    recently first one seen in ER last of July 2016  . 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 Hypogonadism male   . Need for vaccination with combined diphtheria-tetanus-pertussis (DTaP) 05/26/2015   Given today; tetanus part good for 10 years, whooping cough good for life   . Polyuria 05/26/2015  .  Thrombocytosis ([place]) 05/30/2015  . Ureteral stone 06/28/2015    Patient Active Problem List   Diagnosis Date Noted  . 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 Acute pharyngitis 09/06/2015  . 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 Ureteral stone 06/28/2015  . Dental caries 06/09/2015  . Gum disease 06/09/2015  . Low testosterone 06/09/2015  . Lymphocytosis 05/30/2015  . Thrombocytosis ([place]) 05/30/2015  . B12 deficiency 05/30/2015  . Vitamin D deficiency 05/30/2015  . GAD (generalized anxiety disorder) 05/26/2015  . Dysthymia 05/26/2015  . 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 Tobacco abuse 05/26/2015  . Polyuria 05/26/2015  . Need for vaccination with combined diphtheria-tetanus-pertussis (DTaP) 05/26/2015    Past Surgical History:  Procedure Laterality Date  . CHOLECYSTECTOMY    . SPLENECTOMY, TOTAL      Prior to Admission medications   Medication Sig Start Date End Date Taking? Authorizing Provider  busPIRone (BUSPAR) 10 MG tablet Take 10 mg by mouth 4 (four) times daily.  06/08/15   [provider]  Cholecalciferol (VITAMIN D3) 5000 UNITS TABS Take 5,000 Units by mouth once a week.    [provider]  clomiPHENE (CLOMID) 50 MG tablet Take one half a tablet daily 05/24/16   McGowan, Larene Beach A, PA-C  divalproex (DEPAKOTE ER) 500 MG 24 hr tablet Take 500 mg by  mouth daily.  08/12/15   [provider]  hydrOXYzine (VISTARIL) 25 MG capsule Take 1 capsule (25 mg total) by mouth every 6 (six) hours as needed for anxiety. 10/14/15   Arnetha Courser, MD  penicillin v potassium (VEETID) 500 MG tablet Take 1 tablet (500 mg total) by mouth 3 (three) times daily. 09/06/15   Arnetha Courser, MD  QUEtiapine (SEROQUEL) 200 MG tablet Take 400 mg by mouth daily.  07/28/15   [provider]  vitamin B-12 (CYANOCOBALAMIN) 1000 MCG tablet Take 1,000 mcg by mouth daily.    [provider]    Allergies Patient has no known  allergies.  Family History  Problem Relation Age of Onset  . Diabetes Maternal Grandmother   . Alzheimer's disease Paternal Grandmother   . Kidney disease Neg Hx   . Prostate cancer Neg Hx   . Cancer Neg Hx   . Heart disease Neg Hx   . Hypertension Neg Hx   . Stroke Neg Hx   . COPD Neg Hx     Social History Social History  Substance Use Topics  . Smoking status: Current Every Day Smoker    Packs/day: 1.00    Years: 14.00    Types: Cigarettes  . Smokeless tobacco: Never Used  . Alcohol use No    Review of Systems Constitutional: No fever/chills Eyes: No visual changes. ENT: No sore throat. Cardiovascular: See HPI Respiratory: Denies shortness of breath. Gastrointestinal: No abdominal pain.  No nausea, no vomiting.  No diarrhea.  No constipation. Genitourinary: Negative for dysuria. Musculoskeletal: Negative for back pain. Skin: Negative for rash. Neurological: Negative for headaches, focal weakness or numbness.  No recent trips or travel.  No leg swelling.  No history of blood clots.  No recent surgeries or hospitalizations.  Denies cough.  ____________________________________________   PHYSICAL EXAM:  VITAL SIGNS: ED Triage Vitals  Enc Vitals Group     BP 09/14/17 1854 129/78     Pulse Rate 09/14/17 1854 87     Resp 09/14/17 1854 16     Temp 09/14/17 1854 98.1 F (36.7 C)     Temp Source 09/14/17 1854 Oral     SpO2 09/14/17 1854 98 %     Weight 09/14/17 1846 240 lb (108.9 kg)     Height 09/14/17 1846 6\' 1"  (1.854 m)     Head Circumference --      Peak Flow --      Pain Score 09/14/17 1846 3     Pain Loc --      Pain Edu? --      Excl. in [place]? --     Constitutional: Alert and oriented. Well appearing and in no acute distress. Eyes: Conjunctivae are normal. Head: Atraumatic. Nose: No congestion/rhinnorhea. Mouth/Throat: Mucous membranes are moist. Neck: No stridor.   Cardiovascular: Normal rate, regular rhythm. Grossly normal heart sounds.  Good  peripheral circulation. Respiratory: Normal respiratory effort.  No retractions. Lungs CTAB. Gastrointestinal: Soft and nontender. No distention. Musculoskeletal: No lower extremity tenderness nor edema. Neurologic:  Normal speech and language. No gross focal neurologic deficits are appreciated.  Skin:  Skin is warm, dry and intact. No rash noted. Psychiatric: Mood and affect are just slightly flat. Speech and behavior are normal.  ____________________________________________   LABS (all labs ordered are listed, but only abnormal results are displayed)  Labs Reviewed  BASIC METABOLIC PANEL - Abnormal; Notable for the following:       Result Value   Sodium  133 (*)    Potassium 3.2 (*)    Chloride 100 (*)    Glucose, Bld 111 (*)    All other components within normal limits  CBC - Abnormal; Notable for the following:    WBC 22.4 (*)    HCT 39.8 (*)    MCHC 36.6 (*)    Platelets 449 (*)    All other components within normal limits  TROPONIN I  TROPONIN I   ____________________________________________  EKG  ED ECG REPORT I, Julita Ozbun, the attending physician, personally viewed and interpreted this ECG.  Date: 09/14/2017 EKG Time: 1850 Rate: 80 Rhythm: normal sinus rhythm QRS Axis: normal Intervals: normal ST/T Wave abnormalities: normal Narrative Interpretation: no evidence of acute ischemia  ____________________________________________  RADIOLOGY  Dg Chest 2 View  Result Date: 09/14/2017 CLINICAL DATA:  Chest pain and left-sided arm pain, initial encounter EXAM: CHEST  2 VIEW COMPARISON:  None. FINDINGS: Cardiac shadow is within normal limits. Mild interstitial changes are noted bilaterally without focal infiltrate. There is a questionable nodular density identified overlying the posterior left fourth rib. No focal effusion is seen. No bony abnormality is noted. IMPRESSION: Mild interstitial changes which may represent bronchitis. A questionable nodular density is  noted in the left apex. Short-term chest x-ray follow-up is recommended. Electronically Signed   By: Inez Catalina M.D.   On: 09/14/2017 19:37   Ct Angio Chest Pe W Or Wo Contrast  Result Date: 09/14/2017 CLINICAL DATA:  Centralized chest pain with radiation into the left side of the neck. Prior splenectomy. Cholecystectomy. Tobacco abuse. EXAM: CT ANGIOGRAPHY CHEST WITH CONTRAST TECHNIQUE: Multidetector CT imaging of the chest was performed using the standard protocol during bolus administration of intravenous contrast. Multiplanar CT image reconstructions and MIPs were obtained to evaluate the vascular anatomy. CONTRAST:  75 cc of Isovue 370 COMPARISON:  Plain films of earlier today.  No prior CT. FINDINGS: Cardiovascular: The quality of this exam for evaluation of pulmonary embolism is poor to moderate. Limitations include suboptimal bolus timing and below described motion. No central or large lobar pulmonary embolism identified. Normal aortic caliber.  Normal heart size. Mediastinum/Nodes: Mildly prominent bilateral axillary and left subpectoral nodes are likely reactive. No mediastinal or hilar adenopathy. Lungs/Pleura: No pleural fluid. Minimal motion degradation throughout. Nodularity along the right minor fissure is likely related to subpleural lymph nodes. No left apical nodule correspond to the questioned abnormality on plain film. There is a probable subpleural lymph node along the left major fissure at 5 mm on image 40/series 6. Upper Abdomen: Marked hepatic steatosis. Normal imaged portions of the stomach, pancreas. Splenectomy by report. Suspect residual splenic tissue in the left upper quadrant. Musculoskeletal: Convex right thoracic spine curvature. No acute osseous abnormality. Review of the MIP images confirms the above findings. IMPRESSION: 1. Limitations secondary to motion and bolus timing. No large/central pulmonary embolism. 2. No other explanation for chest pain. 3. Hepatic steatosis.  Electronically Signed   By: Abigail Miyamoto M.D.   On: 09/14/2017 21:56    CT of the chest slightly limited, but do not suspect PE in order to stay to further evaluate for mass.  No notable mass is seen, a small subpleural lymph node is noted though this is found in the right fissure. ____________________________________________   PROCEDURES  Procedure(s) performed: None  Procedures  Critical Care performed: No  ____________________________________________   INITIAL IMPRESSION / ASSESSMENT AND PLAN / ED COURSE  Pertinent labs & imaging results that were available during my care  of the patient were reviewed by me and considered in my medical decision making (see chart for details).  Differential diagnosis includes, but is not limited to, ACS, aortic dissection, pulmonary embolism, cardiac tamponade, pneumothorax, pneumonia, pericarditis/myocarditis, GI-related causes including esophagitis/gastritis, and musculoskeletal chest wall pain.    Patient symptomatology somewhat atypical for coronary disease, lack significant risk factors.  Presently reports his pain and symptoms are better but persisted about a 2 out of 10, will give a dose of morphine as well as Maalox.  Plan to obtain a chest x-ray, exclude pulmonary disease which she does not have symptoms of.  No ripping tearing or moving pain.  No risk factors for PE and is PERC negative.  His heart score is low risk   ----------------------------------------- 9:20 PM on 09/14/2017 -----------------------------------------  Patient resting comfortably with family at bedside.  Reports pain is improved.  Discussed with him and based on concern of a possible abnormality noted in the left chest where his pain was associated and ordered a CT scan to further delineate.  Do not think the patient would have a pulmonary embolism, but the possibility of mass in this smoker is considered though early in his life.     ----------------------------------------- 11:36 PM on 09/14/2017 -----------------------------------------  Plan to discharge the patient home.  His second troponin is normal, he is felt to be at low risk for coronary disease based on heart score.  EKG normal.  Symptoms have essentially resolved at this time, he reports he is comfortable with no further pain.  I did discuss with him and his family his elevated white blood cell count which is been seen on previous checks, given this with some enlarged lymph nodes on CT scan I recommended he follow-up closely with Dr. Sanda Klein for further workup.  He is agreeable to this as well as follow-up with cardiology.  With careful return precautions advised, mother and brother will be driving him home.  Return precautions and treatment recommendations and follow-up discussed with the patient who is agreeable with the plan.  ____________________________________________   FINAL CLINICAL IMPRESSION(S) / ED DIAGNOSES  Final diagnoses:  Atypical chest pain  Chest pain with low risk for cardiac etiology      NEW MEDICATIONS STARTED DURING THIS VISIT:  New Prescriptions   No medications on file     Note:  This document was prepared using Dragon voice recognition software and may include unintentional dictation errors.     Delman Kitten, MD 09/14/17 2337

## 2017-09-14 NOTE — Discharge Instructions (Signed)
You have been seen in the Emergency Department (ED) today for chest pain.  As we have discussed today?s test results are normal, but you may require further testing.  Please follow up with the recommended doctor as instructed above in these documents regarding today?s emergent visit and your recent symptoms to discuss further management.  Continue to take your regular medications. If you are not doing so already, please also take a daily baby aspirin (81 mg), at least until you follow up with your doctor.  Return to the Emergency Department (ED) if you experience any further chest pain/pressure/tightness, difficulty breathing, or sudden sweating, or other symptoms that concern you.  Additionally, your white blood cell count has been persistently high few ER visits.  Though I do not believe this is due to an infection at this time, I would recommend he follow-up with your doctor at your primary care physician office for further evaluation regarding on only your chest pain visit, but also further evaluate why your white blood cell count may be continuously elevated.

## 2018-06-22 IMAGING — CR DG CHEST 2V
2 series · 2 of 2 positions shown · non-contrast
Comparison: None.

CLINICAL DATA: Chest pain and left-sided arm pain, initial
encounter

EXAM:
CHEST  2 VIEW

[chest pa]
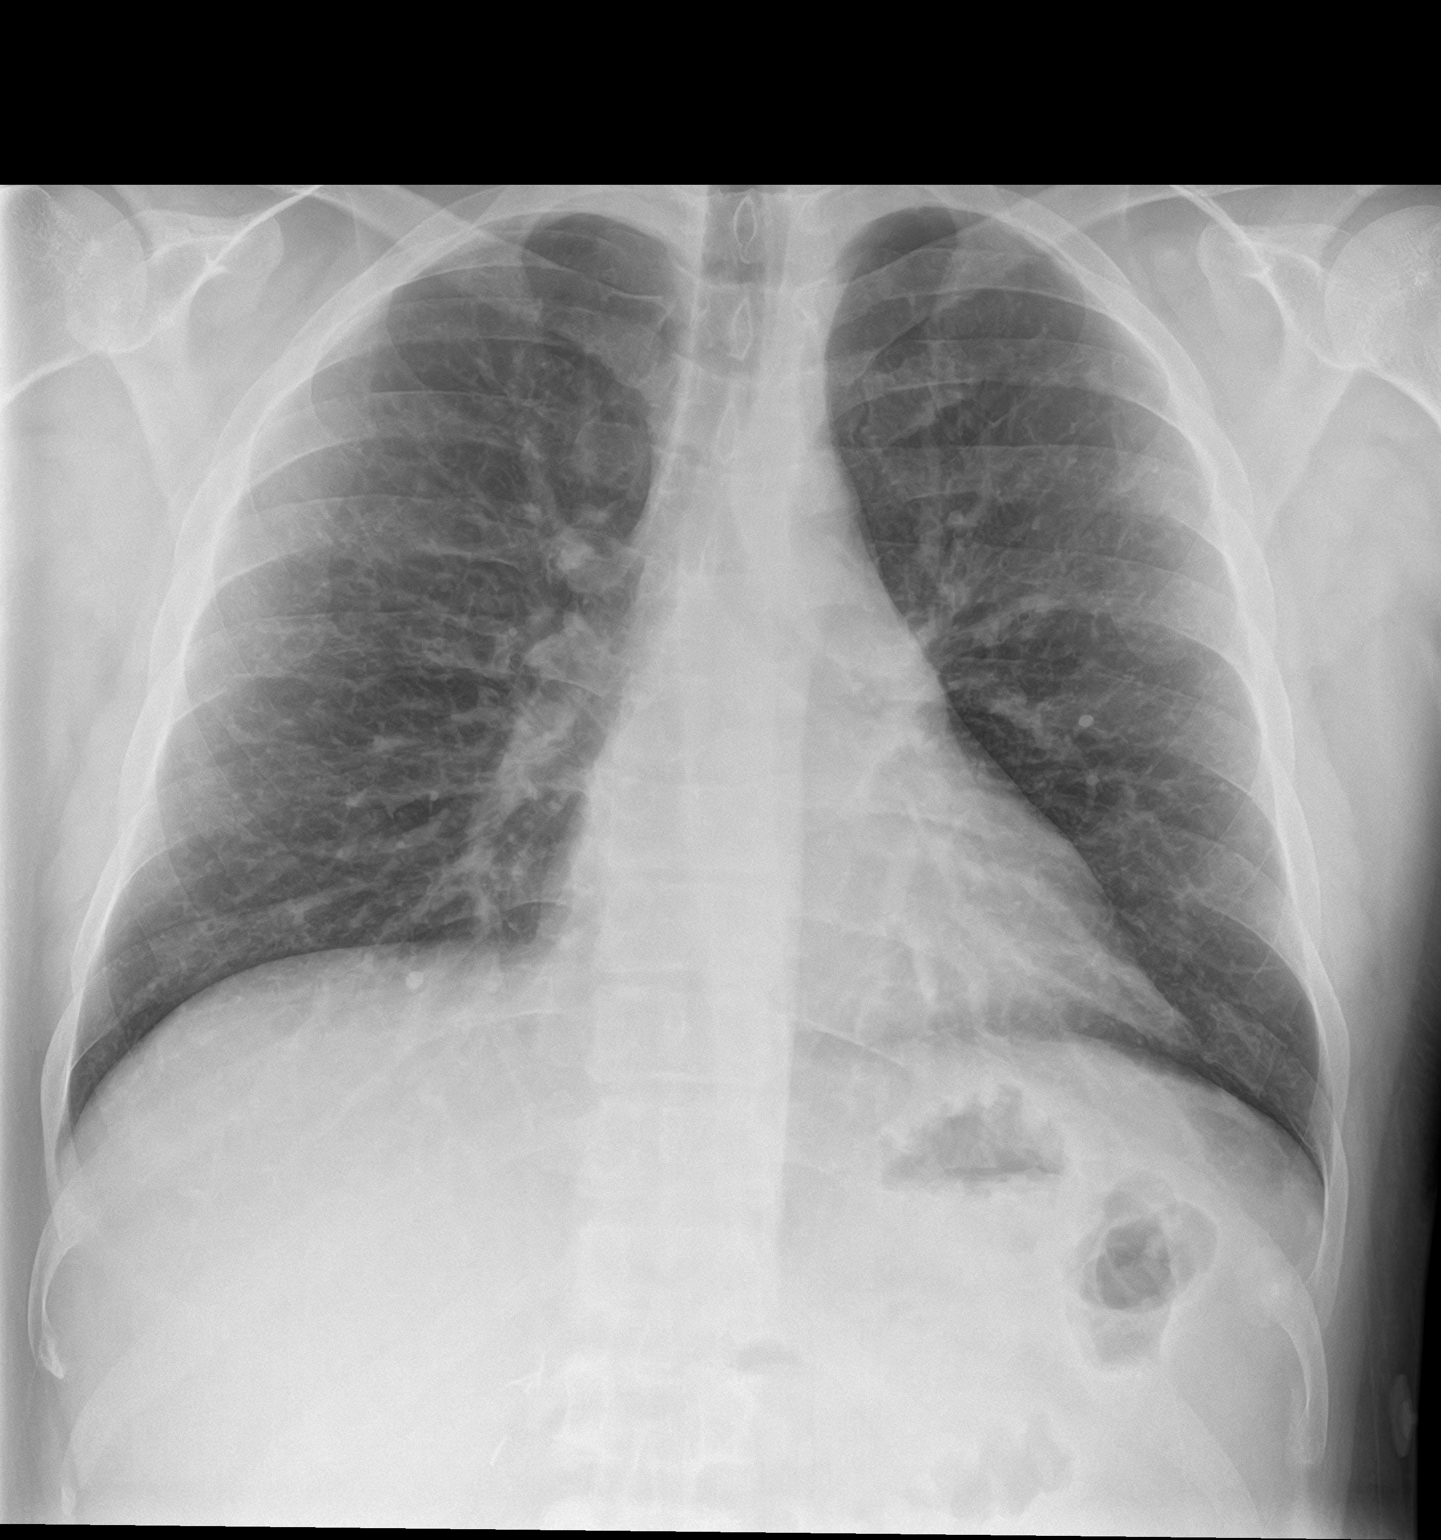

[chest lat]
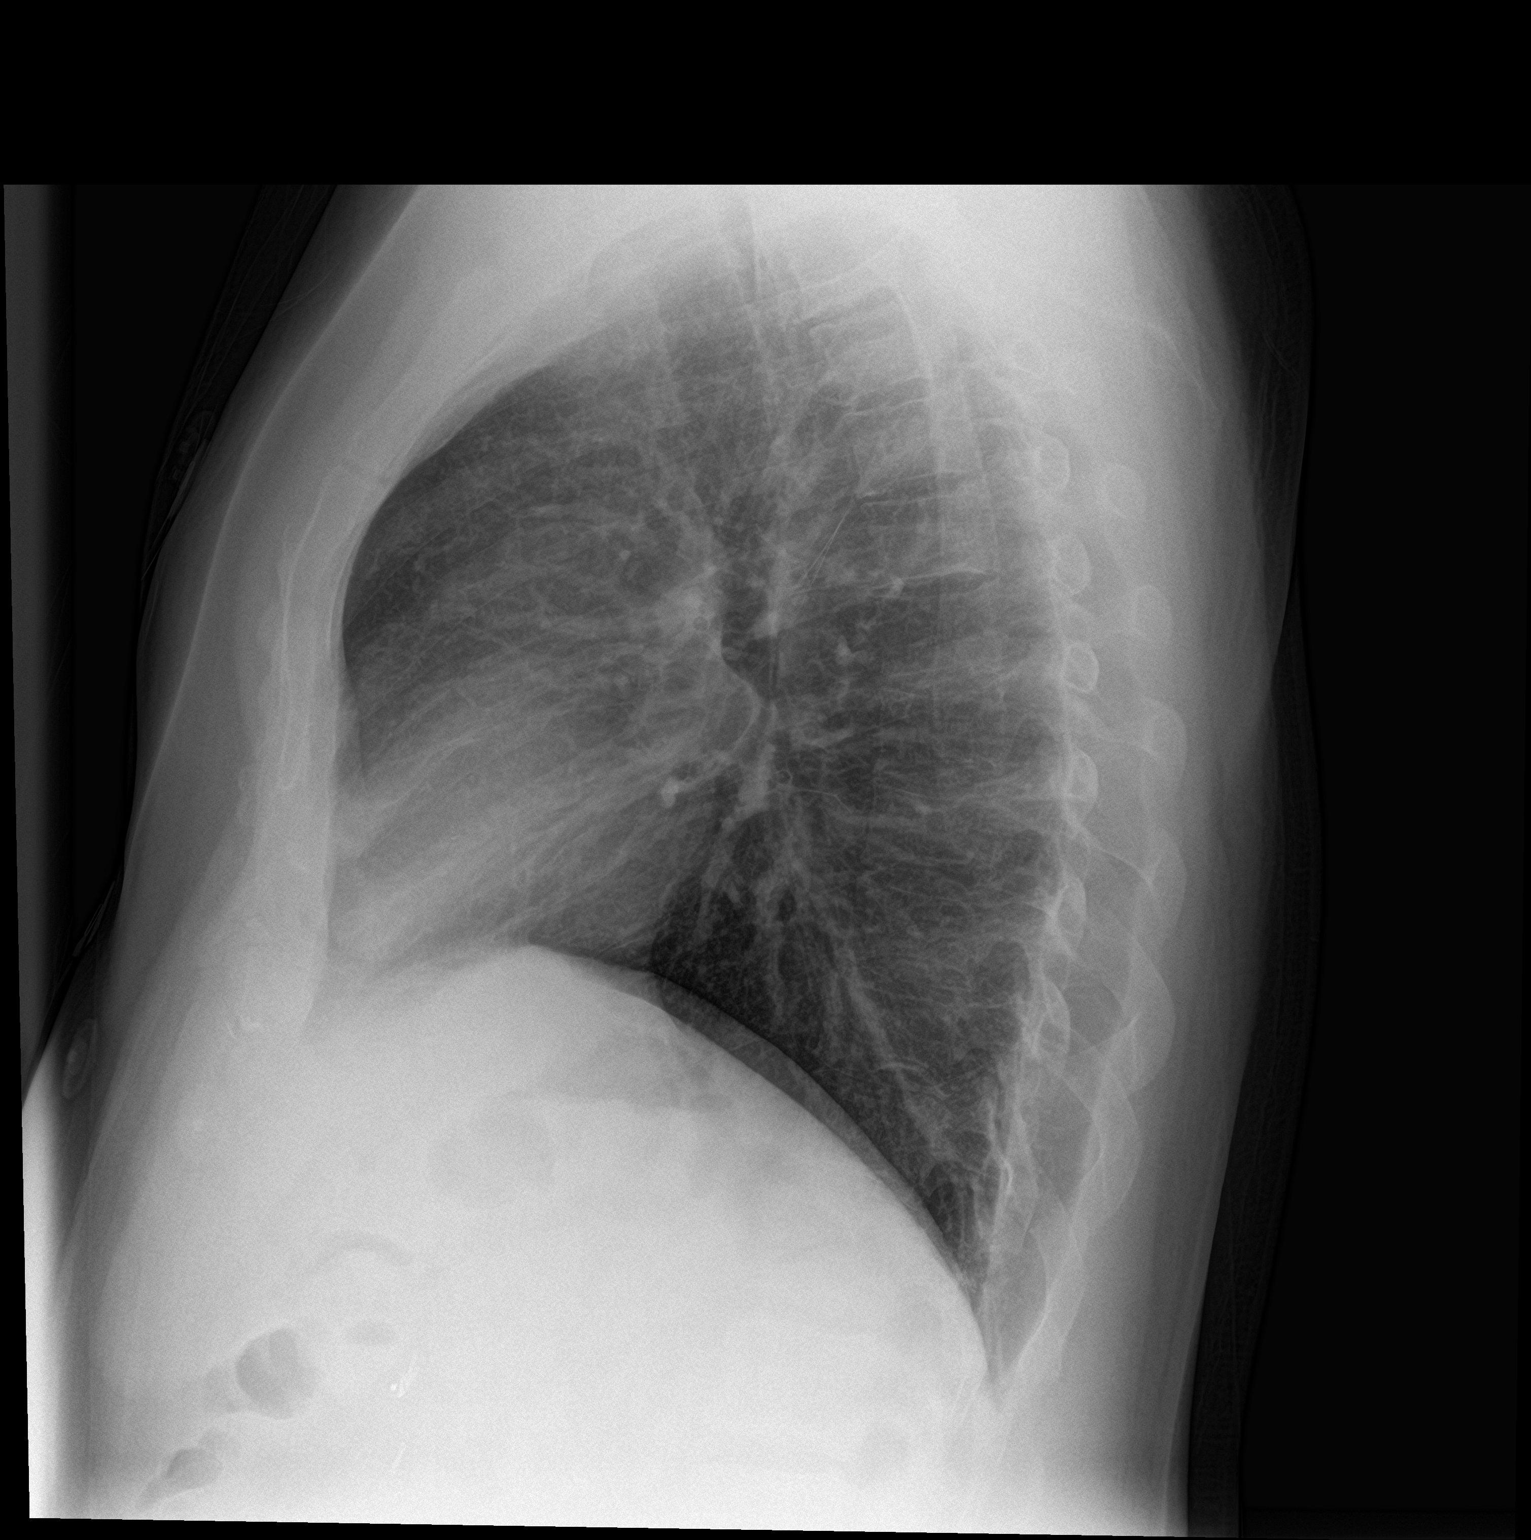

[2 of 2 positions shown; findings below may reference images not displayed]

FINDINGS: Cardiac shadow is within normal limits. Mild interstitial changes
are noted bilaterally without focal infiltrate. There is a
questionable nodular density identified overlying the posterior left
fourth rib. No focal effusion is seen. No bony abnormality is noted.
IMPRESSION: Mild interstitial changes which may represent bronchitis. A
questionable nodular density is noted in the left apex. Short-term
chest x-ray follow-up is recommended.
